# Patient Record
Sex: Male | Born: 1965 | Race: White | Hispanic: No | Marital: Married | Smoking: Never smoker
Health system: Southern US, Community
[De-identification: ages and names within clinical notes are randomized; demographics above are authoritative.]

## PROBLEM LIST (undated history)

## (undated) DIAGNOSIS — S6290XA Unspecified fracture of unspecified wrist and hand, initial encounter for closed fracture: Secondary | ICD-10-CM

## (undated) DIAGNOSIS — K219 Gastro-esophageal reflux disease without esophagitis: Secondary | ICD-10-CM

## (undated) DIAGNOSIS — Z789 Other specified health status: Secondary | ICD-10-CM

## (undated) HISTORY — DX: Gastro-esophageal reflux disease without esophagitis: K21.9

## (undated) HISTORY — DX: Other specified health status: Z78.9

## (undated) HISTORY — DX: Unspecified fracture of unspecified wrist and hand, initial encounter for closed fracture: S62.90XA

## (undated) HISTORY — PX: KNEE ARTHROSCOPY: SUR90

## (undated) HISTORY — PX: OTHER SURGICAL HISTORY: SHX169

## (undated) HISTORY — PX: WISDOM TOOTH EXTRACTION: SHX21

---

## 2005-02-13 ENCOUNTER — Emergency Department (HOSPITAL_COMMUNITY): Admission: EM | Admit: 2005-02-13 | Discharge: 2005-02-14 | Payer: Self-pay | Admitting: Emergency Medicine

## 2013-08-29 ENCOUNTER — Ambulatory Visit: Payer: Self-pay | Admitting: Family Medicine

## 2013-11-02 ENCOUNTER — Telehealth: Payer: Self-pay

## 2013-11-02 NOTE — Telephone Encounter (Addendum)
New Patient  Previous PCP:  Dr. Jacelyn Grip at [facility]  Medication List and allergies:  Updated and Reviewed  90 day supply/mail order: n/a Local prescriptions:  [id number] - [place], [address] AT [place] [address]  Immunization due:  UTD per patient  A/P: No changes to personal, family or PSH Flu- did not receive Tdap- unsure of when last received, states it has been within the past 10 years   To discuss with Wyatt Thorstenson: Nothing at this time.

## 2013-11-03 ENCOUNTER — Encounter: Payer: Self-pay | Admitting: General Practice

## 2013-11-03 ENCOUNTER — Encounter: Payer: Self-pay | Admitting: Family Medicine

## 2013-11-03 ENCOUNTER — Ambulatory Visit (INDEPENDENT_AMBULATORY_CARE_PROVIDER_SITE_OTHER): Payer: BC Managed Care – PPO | Admitting: Family Medicine

## 2013-11-03 VITALS — BP 120/80 | HR 70 | Temp 98.0°F | Resp 16 | Ht 73.0 in | Wt 185.2 lb

## 2013-11-03 DIAGNOSIS — Z0000 Encounter for general adult medical examination without abnormal findings: Secondary | ICD-10-CM

## 2013-11-03 DIAGNOSIS — Z23 Encounter for immunization: Secondary | ICD-10-CM

## 2013-11-03 LAB — BASIC METABOLIC PANEL
BUN: 14 mg/dL (ref 6–23)
CO2: 30 mEq/L (ref 19–32)
Calcium: 9.5 mg/dL (ref 8.4–10.5)
Chloride: 106 mEq/L (ref 96–112)
Creatinine, Ser: 1 mg/dL (ref 0.4–1.5)
GFR: 82.7 mL/min (ref >60.00)
Glucose, Bld: 86 mg/dL (ref 70–99)
Potassium: 3.9 mEq/L (ref 3.5–5.1)
Sodium: 141 mEq/L (ref 135–145)

## 2013-11-03 LAB — CBC WITH DIFFERENTIAL/PLATELET
Basophils Absolute: 0 10*3/uL (ref 0.0–0.1)
Basophils Relative: 0.3 % (ref 0.0–3.0)
Eosinophils Absolute: 0.3 10*3/uL (ref 0.0–0.7)
Eosinophils Relative: 4.1 % (ref 0.0–5.0)
HCT: 45 % (ref 39.0–52.0)
Hemoglobin: 15.5 g/dL (ref 13.0–17.0)
Lymphocytes Relative: 28.2 % (ref 12.0–46.0)
Lymphs Abs: 1.9 10*3/uL (ref 0.7–4.0)
MCHC: 34.5 g/dL (ref 30.0–36.0)
MCV: 89.7 fl (ref 78.0–100.0)
Monocytes Absolute: 0.4 10*3/uL (ref 0.1–1.0)
Monocytes Relative: 5.8 % (ref 3.0–12.0)
Neutro Abs: 4.1 10*3/uL (ref 1.4–7.7)
Neutrophils Relative %: 61.6 % (ref 43.0–77.0)
Platelets: 205 10*3/uL (ref 150.0–400.0)
RBC: 5.01 Mil/uL (ref 4.22–5.81)
RDW: 13.6 % (ref 11.5–15.5)
WBC: 6.7 10*3/uL (ref 4.0–10.5)

## 2013-11-03 LAB — HEPATIC FUNCTION PANEL
ALT: 21 U/L (ref 0–53)
AST: 24 U/L (ref 0–37)
Albumin: 4.4 g/dL (ref 3.5–5.2)
Alkaline Phosphatase: 46 U/L (ref 39–117)
Bilirubin, Direct: 0.2 mg/dL (ref 0.0–0.3)
Total Bilirubin: 1.3 mg/dL — ABNORMAL HIGH (ref 0.2–1.2)
Total Protein: 7.2 g/dL (ref 6.0–8.3)

## 2013-11-03 LAB — TSH: TSH: 2.29 u[IU]/mL (ref 0.35–4.50)

## 2013-11-03 LAB — LIPID PANEL
Cholesterol: 156 mg/dL (ref 0–200)
HDL: 40.2 mg/dL (ref >39.00)
LDL Cholesterol: 81 mg/dL (ref 0–99)
NonHDL: 115.8
Total CHOL/HDL Ratio: 4
Triglycerides: 175 mg/dL — ABNORMAL HIGH (ref 0.0–149.0)
VLDL: 35 mg/dL (ref 0.0–40.0)

## 2013-11-03 LAB — PSA: PSA: 0.9 ng/mL (ref 0.10–4.00)

## 2013-11-03 NOTE — Progress Notes (Signed)
   Subjective:    Patient ID: Darrell Reid, male    DOB: 24-Oct-1965, 48 y.o.   MRN: [id number]  HPI New to establish.  Previous MD- Yaakov Guthrie, [place] (last seen 5 yrs ago)  CPE- no concerns today   Review of Systems Patient reports no vision/hearing changes, anorexia, fever ,adenopathy, persistant/recurrent hoarseness, swallowing issues, chest pain, palpitations, edema, persistant/recurrent cough, hemoptysis, dyspnea (rest,exertional, paroxysmal nocturnal), gastrointestinal  bleeding (melena, rectal bleeding), abdominal pain, excessive heart burn, GU symptoms (dysuria, hematuria, voiding/incontinence issues) syncope, focal weakness, memory loss, numbness & tingling, skin/hair/nail changes, depression, anxiety, abnormal bruising/bleeding, musculoskeletal symptoms/signs.     Objective:   Physical Exam General Appearance:    Alert, cooperative, no distress, appears stated age  Head:    Normocephalic, without obvious abnormality, atraumatic  Eyes:    PERRL, conjunctiva/corneas clear, EOM's intact, fundi    benign, both eyes       Ears:    Normal TM's and external ear canals, both ears  Nose:   Nares normal, septum midline, mucosa normal, no drainage   or sinus tenderness  Throat:   Lips, mucosa, and tongue normal; teeth and gums normal  Neck:   Supple, symmetrical, trachea midline, no adenopathy;       thyroid:  No enlargement/tenderness/nodules  Back:     Symmetric, no curvature, ROM normal, no CVA tenderness  Lungs:     Clear to auscultation bilaterally, respirations unlabored  Chest wall:    No tenderness or deformity  Heart:    Regular rate and rhythm, S1 and S2 normal, no murmur, rub   or gallop  Abdomen:     Soft, non-tender, bowel sounds active all four quadrants,    no masses, no organomegaly  Genitalia:    Normal male without lesion, masses,discharge or tenderness  Rectal:    Deferred due to young age  Extremities:   Extremities normal, atraumatic, no cyanosis or edema    Pulses:   2+ and symmetric all extremities  Skin:   Skin color, texture, turgor normal, no rashes or lesions, large sebaceous cyst overlying R shoulder blade  Lymph nodes:   Cervical, supraclavicular, and axillary nodes normal  Neurologic:   CNII-XII intact. Normal strength, sensation and reflexes      throughout          Assessment & Plan:

## 2013-11-03 NOTE — Assessment & Plan Note (Signed)
Pt's PE WNL.  Check labs.  Anticipatory guidance provided.  

## 2013-11-03 NOTE — Progress Notes (Signed)
Pre visit review using our clinic review tool, if applicable. No additional management support is needed unless otherwise documented below in the visit note. 

## 2013-11-03 NOTE — Patient Instructions (Signed)
Follow up in 1 year or as needed Keep up the good work on healthy diet and regular exercise We'll notify you of your lab results and make any changes if needed Call with any questions or concerns Welcome!  We're glad to have you!

## 2014-10-19 ENCOUNTER — Telehealth: Payer: Self-pay | Admitting: Family Medicine

## 2014-10-19 NOTE — Telephone Encounter (Signed)
Pre Visit letter sent  °

## 2014-11-08 ENCOUNTER — Encounter: Payer: Self-pay | Admitting: *Deleted

## 2014-11-08 ENCOUNTER — Telehealth: Payer: Self-pay | Admitting: *Deleted

## 2014-11-08 NOTE — Telephone Encounter (Signed)
Pre-Visit Call completed with patient and chart updated.   Pre-Visit Info documented in Specialty Comments under [employer].    

## 2014-11-09 ENCOUNTER — Encounter: Payer: Self-pay | Admitting: Family Medicine

## 2014-11-09 ENCOUNTER — Ambulatory Visit (INDEPENDENT_AMBULATORY_CARE_PROVIDER_SITE_OTHER): Payer: BLUE CROSS/BLUE SHIELD | Admitting: Family Medicine

## 2014-11-09 VITALS — BP 122/82 | HR 60 | Temp 97.9°F | Resp 16 | Ht 73.0 in | Wt 186.5 lb

## 2014-11-09 DIAGNOSIS — L578 Other skin changes due to chronic exposure to nonionizing radiation: Secondary | ICD-10-CM

## 2014-11-09 DIAGNOSIS — Z0000 Encounter for general adult medical examination without abnormal findings: Secondary | ICD-10-CM | POA: Diagnosis not present

## 2014-11-09 DIAGNOSIS — Z3009 Encounter for other general counseling and advice on contraception: Secondary | ICD-10-CM

## 2014-11-09 NOTE — Assessment & Plan Note (Signed)
Pt's PE WNL w/ exception of sun damage on face.  Refer to derm.  Refer to urology for vasectomy consultation.  Check labs.  Anticipatory guidance provided.

## 2014-11-09 NOTE — Progress Notes (Signed)
   Subjective:    Patient ID: Darrell Reid, male    DOB: 27-May-1965, 49 y.o.   MRN: [id number]  HPI CPE- concerns for sun damage, wants referral for vasectomy    Review of Systems Patient reports no vision/hearing changes, anorexia, fever ,adenopathy, persistant/recurrent hoarseness, swallowing issues, chest pain, palpitations, edema, persistant/recurrent cough, hemoptysis, dyspnea (rest,exertional, paroxysmal nocturnal), gastrointestinal  bleeding (melena, rectal bleeding), abdominal pain, excessive heart burn, GU symptoms (dysuria, hematuria, voiding/incontinence issues) syncope, focal weakness, memory loss, numbness & tingling, hair/nail changes, depression, anxiety, abnormal bruising/bleeding, musculoskeletal symptoms/signs.     Objective:   Physical Exam General Appearance:    Alert, cooperative, no distress, appears stated age  Head:    Normocephalic, without obvious abnormality, atraumatic  Eyes:    PERRL, conjunctiva/corneas clear, EOM's intact, fundi    benign, both eyes       Ears:    Normal TM's and external ear canals, both ears  Nose:   Nares normal, septum midline, mucosa normal, no drainage   or sinus tenderness  Throat:   Lips, mucosa, and tongue normal; teeth and gums normal  Neck:   Supple, symmetrical, trachea midline, no adenopathy;       thyroid:  No enlargement/tenderness/nodules  Back:     Symmetric, no curvature, ROM normal, no CVA tenderness  Lungs:     Clear to auscultation bilaterally, respirations unlabored  Chest wall:    No tenderness or deformity  Heart:    Regular rate and rhythm, S1 and S2 normal, no murmur, rub   or gallop  Abdomen:     Soft, non-tender, bowel sounds active all four quadrants,    no masses, no organomegaly  Genitalia:    Deferred to urology  Rectal:    Extremities:   Extremities normal, atraumatic, no cyanosis or edema  Pulses:   2+ and symmetric all extremities  Skin:   Skin color, texture, turgor normal, sun damage noted on  bridge of nose  Lymph nodes:   Cervical, supraclavicular, and axillary nodes normal  Neurologic:   CNII-XII intact. Normal strength, sensation and reflexes      throughout          Assessment & Plan:

## 2014-11-09 NOTE — Patient Instructions (Signed)
Follow up in 1 year or as needed We'll notify you of your lab results and make any changes if needed Start a once daily multivitamin We'll call you with your Dermatology and Urology appt Call with any questions or concerns Keep up the good work! Congrats on the baby! Happy 4th of July!!!

## 2014-11-09 NOTE — Progress Notes (Signed)
Pre visit review using our clinic review tool, if applicable. No additional management support is needed unless otherwise documented below in the visit note. 

## 2014-11-10 ENCOUNTER — Encounter: Payer: Self-pay | Admitting: General Practice

## 2014-11-10 LAB — PSA: PSA: 0.89 ng/mL (ref 0.10–4.00)

## 2014-11-10 LAB — CBC WITH DIFFERENTIAL/PLATELET
Basophils Absolute: 0 10*3/uL (ref 0.0–0.1)
Basophils Relative: 0.4 % (ref 0.0–3.0)
Eosinophils Absolute: 0.3 10*3/uL (ref 0.0–0.7)
Eosinophils Relative: 4.5 % (ref 0.0–5.0)
HCT: 45.9 % (ref 39.0–52.0)
Hemoglobin: 15.6 g/dL (ref 13.0–17.0)
Lymphocytes Relative: 27.1 % (ref 12.0–46.0)
Lymphs Abs: 1.6 10*3/uL (ref 0.7–4.0)
MCHC: 34.1 g/dL (ref 30.0–36.0)
MCV: 90.5 fl (ref 78.0–100.0)
Monocytes Absolute: 0.3 10*3/uL (ref 0.1–1.0)
Monocytes Relative: 5.6 % (ref 3.0–12.0)
Neutro Abs: 3.8 10*3/uL (ref 1.4–7.7)
Neutrophils Relative %: 62.4 % (ref 43.0–77.0)
Platelets: 181 10*3/uL (ref 150.0–400.0)
RBC: 5.08 Mil/uL (ref 4.22–5.81)
RDW: 13.3 % (ref 11.5–15.5)
WBC: 6 10*3/uL (ref 4.0–10.5)

## 2014-11-10 LAB — LIPID PANEL
Cholesterol: 149 mg/dL (ref 0–200)
HDL: 38.1 mg/dL — ABNORMAL LOW (ref >39.00)
LDL Cholesterol: 87 mg/dL (ref 0–99)
NonHDL: 110.9
Total CHOL/HDL Ratio: 4
Triglycerides: 118 mg/dL (ref 0.0–149.0)
VLDL: 23.6 mg/dL (ref 0.0–40.0)

## 2014-11-10 LAB — BASIC METABOLIC PANEL
BUN: 18 mg/dL (ref 6–23)
CO2: 27 meq/L (ref 19–32)
Calcium: 9.5 mg/dL (ref 8.4–10.5)
Chloride: 105 mEq/L (ref 96–112)
Creatinine, Ser: 1.08 mg/dL (ref 0.40–1.50)
GFR: 77.95 mL/min (ref >60.00)
Glucose, Bld: 83 mg/dL (ref 70–99)
POTASSIUM: 4 meq/L (ref 3.5–5.1)
Sodium: 141 mEq/L (ref 135–145)

## 2014-11-10 LAB — HEPATIC FUNCTION PANEL
ALT: 15 U/L (ref 0–53)
AST: 20 U/L (ref 0–37)
Albumin: 4.4 g/dL (ref 3.5–5.2)
Alkaline Phosphatase: 51 U/L (ref 39–117)
Bilirubin, Direct: 0.2 mg/dL (ref 0.0–0.3)
Total Bilirubin: 0.8 mg/dL (ref 0.2–1.2)
Total Protein: 6.9 g/dL (ref 6.0–8.3)

## 2014-11-10 LAB — TSH: TSH: 2.07 u[IU]/mL (ref 0.35–4.50)

## 2015-11-08 ENCOUNTER — Encounter: Payer: BLUE CROSS/BLUE SHIELD | Admitting: Family Medicine

## 2015-11-12 ENCOUNTER — Encounter: Payer: BLUE CROSS/BLUE SHIELD | Admitting: Family Medicine

## 2015-11-14 ENCOUNTER — Encounter: Payer: BLUE CROSS/BLUE SHIELD | Admitting: Family Medicine

## 2016-08-13 HISTORY — PX: SHOULDER ARTHROSCOPY: SHX128

## 2016-11-20 ENCOUNTER — Encounter: Payer: Self-pay | Admitting: Family Medicine

## 2016-11-20 ENCOUNTER — Ambulatory Visit (INDEPENDENT_AMBULATORY_CARE_PROVIDER_SITE_OTHER): Payer: 59 | Admitting: Family Medicine

## 2016-11-20 ENCOUNTER — Telehealth: Payer: Self-pay | Admitting: Family Medicine

## 2016-11-20 VITALS — BP 120/86 | HR 99 | Temp 98.4°F | Ht 73.0 in | Wt 191.4 lb

## 2016-11-20 DIAGNOSIS — L290 Pruritus ani: Secondary | ICD-10-CM

## 2016-11-20 DIAGNOSIS — R195 Other fecal abnormalities: Secondary | ICD-10-CM | POA: Diagnosis not present

## 2016-11-20 MED ORDER — MEBENDAZOLE 100 MG PO CHEW: 100.0000 mg | CHEWABLE_TABLET | Freq: Once | ORAL | 0 refills | Status: DC

## 2016-11-20 NOTE — Telephone Encounter (Signed)
Pt would like to switch providers to Dr. Nani Ravens from Dr. [facility]. Pt says that the [place] is closer for him to travel    Is this switch okay?

## 2016-11-20 NOTE — Progress Notes (Signed)
Chief Complaint  Patient presents with  . Acute Visit    anus problem    Subjective: Patient is a 51 y.o. male here for anal issue.  Over the past 3 months, the patient is been feeling like something is crawling around the outside of his anus. Other associated symptoms include loose stools. He thought that there was an infestation of a birds nest and wonders if he could've infected and self with his daughters dirty diaper. He is not having any itching, pain, bleeding, weight loss, fevers, and is not visualized any foreign body or parasite. He has no sick contacts and no recent travel. He does not take any medications routinely and does not partake in illicit substance use.   ROS: Const: No fevers GI: As noted in HPI  Family History  Problem Relation Age of Onset  . Cancer Father        lung   Past Medical History:  Diagnosis Date  . No known health problems    No Known Allergies  Current Outpatient Prescriptions:  .  mebendazole (VERMOX) 100 MG chewable tablet, Chew 1 tablet (100 mg total) by mouth once., Disp: 1 tablet, Rfl: 0  Objective: BP 120/86 (BP Location: Left Arm, Patient Position: Sitting, Cuff Size: Normal)   Pulse 99   Temp 98.4 F (36.9 C) (Oral)   Ht 6\' 1"  (1.854 m)   Wt 191 lb 6 oz (86.8 kg)   SpO2 98%   BMI 25.25 kg/m  General: Awake, appears stated age HEENT: MMM, EOMi Heart: RRR, no murmurs Lungs: CTAB, no rales, wheezes or rhonchi. No accessory muscle use Abd: BS+, soft, NT, ND, no masses or organomegaly Rectal: I see some loss of epidermal layer of anal opening suggestive of rubbing/irritation; I do not see any eggs or parasites, no hemorrhoids, scotch tape test unremarkable, sphincter of good done, I did not appreciate any masses or internal hemorrhoids. Psych: Age appropriate judgment and insight, anxious  Assessment and Plan: Anal pruritus - Plan: mebendazole (VERMOX) 100 MG chewable tablet  Loose stools - Plan: Stool Culture, Ova and parasite  examination  Empiric tx for pin worms given hx, don't take before leaving stool sample. If no improvement, will reassess and consider referral to GI vs ID.  F/u in 3 weeks.  The patient voiced understanding and agreement to the plan.  [place], DO 11/20/16  5:04 PM

## 2016-11-20 NOTE — Telephone Encounter (Signed)
Ok to switch 

## 2016-11-20 NOTE — Telephone Encounter (Signed)
Pt has been scheduled.  °

## 2016-11-20 NOTE — Progress Notes (Signed)
Pre visit review using our clinic review tool, if applicable. No additional management support is needed unless otherwise documented below in the visit note. 

## 2016-11-20 NOTE — Patient Instructions (Addendum)
Do not take medicine until you give your stool sample.   Let us know if you need anything in the meantime.

## 2016-11-20 NOTE — Telephone Encounter (Signed)
OK w me.  

## 2016-11-24 ENCOUNTER — Telehealth: Payer: Self-pay | Admitting: Family Medicine

## 2016-11-24 LAB — OVA AND PARASITE EXAMINATION: OP: NONE SEEN

## 2016-11-24 NOTE — Telephone Encounter (Signed)
Called and informed the pt that results have not been completed.   Informed him once the results are back we will give him a call.  Pt verbalized understanding and stated that he had some questions for Dr. Nani Ravens but he could not talk at the moment.  So he will try to activate MyChart and sent Dr. Nani Ravens message.//AB/CMA

## 2016-11-24 NOTE — Telephone Encounter (Signed)
Pt called in for lab results.    CB:[phone]

## 2016-11-26 ENCOUNTER — Telehealth: Payer: Self-pay | Admitting: Family Medicine

## 2016-11-26 LAB — STOOL CULTURE

## 2016-11-26 NOTE — Telephone Encounter (Signed)
Called pt and he relayed that things have gotten a little better. There was a miscommunication and he did not realize that we called something in. He has started to see small white flecks in his stool and even on his face. It is improving. He states that these flecks were not present when he turned in his stool sample. All questions answered. F/u as originally scheduled.

## 2016-11-26 NOTE — Telephone Encounter (Signed)
Patient returned call informed patient  "stool culture is normal. I will see him at his follow up unless anything new comes up." patient  Voice understanding and stated he would like to speak with Dr. Nani Ravens directly patient did not want to elaborate,please advise

## 2016-11-28 ENCOUNTER — Telehealth: Payer: Self-pay

## 2016-11-28 ENCOUNTER — Other Ambulatory Visit: Payer: Self-pay | Admitting: *Deleted

## 2016-11-28 DIAGNOSIS — L290 Pruritus ani: Secondary | ICD-10-CM

## 2016-11-28 MED ORDER — MEBENDAZOLE 100 MG PO CHEW: 100.0000 mg | CHEWABLE_TABLET | Freq: Once | ORAL | 0 refills | Status: AC

## 2016-11-28 NOTE — Telephone Encounter (Signed)
PA initiated via Covermymeds; KEY: [id number]. Awaiting determination.

## 2016-12-03 ENCOUNTER — Encounter: Payer: Self-pay | Admitting: Family Medicine

## 2016-12-03 NOTE — Telephone Encounter (Signed)
Still waiting on authorization so it can be paid for. He can try Pin-X which is available over the counter. Make sure to repeat after 2 weeks. TY.

## 2016-12-03 NOTE — Telephone Encounter (Signed)
Patient is requesting to speak to Dr Nani Ravens regarding PA

## 2016-12-03 NOTE — Telephone Encounter (Signed)
Still awaiting determination.

## 2016-12-03 NOTE — Telephone Encounter (Signed)
Called and spoke with the pt and informed him of the message below.  He verbalized understanding and stated that he has been using the Pin-X and it helped and the stool was better.  But now it seems to be coming back.  He said that he will send Dr. Nani Ravens a MyChart message to let him know what is going on.//AB/CMA

## 2016-12-03 NOTE — Telephone Encounter (Signed)
Pt called in to check the status of his medication. Advised pt that I would have someone to look into for him.   He would like to be advised once a determination has been made.

## 2016-12-05 ENCOUNTER — Telehealth: Payer: Self-pay | Admitting: Family Medicine

## 2016-12-05 NOTE — Telephone Encounter (Signed)
Darrell Reid / pt's insurance says that medication Emverm is requiring a PA. He is requesting that someone from the office call and have PA set for urgent for an immediate response.    CB: [phone]

## 2016-12-08 ENCOUNTER — Encounter: Payer: Self-pay | Admitting: Family Medicine

## 2016-12-08 NOTE — Telephone Encounter (Signed)
PA has been sent awaiting approval.//AB/CMA

## 2016-12-11 NOTE — Telephone Encounter (Signed)
PA denied; Pt must have history of failure, contraindication or intolerance to over-the-counter pyrantel pamoate.

## 2016-12-12 ENCOUNTER — Encounter: Payer: Self-pay | Admitting: Family Medicine

## 2016-12-12 ENCOUNTER — Ambulatory Visit (INDEPENDENT_AMBULATORY_CARE_PROVIDER_SITE_OTHER): Payer: 59 | Admitting: Family Medicine

## 2016-12-12 VITALS — BP 136/88 | HR 70 | Temp 98.2°F | Ht 73.0 in | Wt 188.4 lb

## 2016-12-12 DIAGNOSIS — Z09 Encounter for follow-up examination after completed treatment for conditions other than malignant neoplasm: Secondary | ICD-10-CM | POA: Diagnosis not present

## 2016-12-12 NOTE — Progress Notes (Signed)
Chief Complaint  Patient presents with  . Transitions Of Care    Patient is here today to F/U with anal pruritus from 7.12.18.    Subjective: Patient is a 51 y.o. male here for f/u pinworms.  He was prescribed mebendazole however insurance denied the claim. He has taken 2 doses of Pin X and has had resolution of symptoms in his rectal area. Intermittently, he will notice a crawling sensation on his face. He will wait to scratch it to see if something is moving. Every time he looks in the mirror or grabs at it, there is nothing there. He is not having any nausea, vomiting, abdominal pain, or diarrhea.   ROS: GI: as noted in HPI Skin: No skin lesions  Family History  Problem Relation Age of Onset  . Cancer Father        lung   Past Medical History:  Diagnosis Date  . No known health problems    No Known Allergies  He is not taking any medications routinely.  Objective: BP 136/88 (BP Location: Left Arm, Patient Position: Sitting, Cuff Size: Normal)   Pulse 70   Temp 98.2 F (36.8 C) (Oral)   Ht 6\' 1"  (1.854 m)   Wt 188 lb 6.4 oz (85.5 kg)   SpO2 99%   BMI 24.86 kg/m  General: Awake, appears stated age HEENT: MMM, EOMi Heart: RRR, no murmurs Lungs: CTAB, no rales, wheezes or rhonchi. No accessory muscle use Abd: BS+, soft, NT, ND, no masses or organomegaly Skin: I do not appreciate any foreign bodies or organisms on his face, and his ears, or in his nose. Psych: Age appropriate judgment and insight, normal affect and mood  Assessment and Plan: Follow-up for resolved condition  Doing well, no further tx needed. The patient voiced understanding and agreement to the plan.  Greater than 25 minutes were spent face to face with the patient with greater than 50% of this time spent counseling on treating family members, need for tx for himself, hypervigilance on normal itches, transmissibility, and follow up.    [place], DO 12/12/16  10:06 AM

## 2016-12-12 NOTE — Patient Instructions (Signed)
Let us know if you need anything.  

## 2017-03-04 ENCOUNTER — Ambulatory Visit (INDEPENDENT_AMBULATORY_CARE_PROVIDER_SITE_OTHER): Payer: 59 | Admitting: Family Medicine

## 2017-03-04 ENCOUNTER — Encounter: Payer: Self-pay | Admitting: Family Medicine

## 2017-03-04 VITALS — BP 108/82 | HR 76 | Temp 98.6°F | Ht 72.0 in | Wt 189.2 lb

## 2017-03-04 DIAGNOSIS — Z0000 Encounter for general adult medical examination without abnormal findings: Secondary | ICD-10-CM | POA: Diagnosis not present

## 2017-03-04 DIAGNOSIS — Z114 Encounter for screening for human immunodeficiency virus [HIV]: Secondary | ICD-10-CM

## 2017-03-04 LAB — COMPREHENSIVE METABOLIC PANEL
ALK PHOS: 44 U/L (ref 39–117)
ALT: 23 U/L (ref 0–53)
AST: 21 U/L (ref 0–37)
Albumin: 4.4 g/dL (ref 3.5–5.2)
BUN: 16 mg/dL (ref 6–23)
CALCIUM: 9.7 mg/dL (ref 8.4–10.5)
CO2: 32 mEq/L (ref 19–32)
CREATININE: 1.02 mg/dL (ref 0.40–1.50)
Chloride: 105 mEq/L (ref 96–112)
GFR: 82.46 mL/min (ref >60.00)
Glucose, Bld: 97 mg/dL (ref 70–99)
Potassium: 4.4 mEq/L (ref 3.5–5.1)
Sodium: 143 mEq/L (ref 135–145)
Total Bilirubin: 0.9 mg/dL (ref 0.2–1.2)
Total Protein: 7 g/dL (ref 6.0–8.3)

## 2017-03-04 LAB — LIPID PANEL
Cholesterol: 152 mg/dL (ref 0–200)
HDL: 37 mg/dL — ABNORMAL LOW (ref >39.00)
LDL CALC: 88 mg/dL (ref 0–99)
NonHDL: 115.33
Total CHOL/HDL Ratio: 4
Triglycerides: 139 mg/dL (ref 0.0–149.0)
VLDL: 27.8 mg/dL (ref 0.0–40.0)

## 2017-03-04 LAB — CBC
HEMATOCRIT: 48.2 % (ref 39.0–52.0)
Hemoglobin: 16.4 g/dL (ref 13.0–17.0)
MCHC: 34 g/dL (ref 30.0–36.0)
MCV: 90.9 fl (ref 78.0–100.0)
Platelets: 193 10*3/uL (ref 150.0–400.0)
RBC: 5.31 Mil/uL (ref 4.22–5.81)
RDW: 13.1 % (ref 11.5–15.5)
WBC: 5.1 10*3/uL (ref 4.0–10.5)

## 2017-03-04 LAB — HIV ANTIBODY (ROUTINE TESTING W REFLEX): HIV 1&2 Ab, 4th Generation: NONREACTIVE

## 2017-03-04 NOTE — Patient Instructions (Addendum)
Brush your teeth twice daily.  Aim to do some physical exertion for 150 minutes per week. This is typically divided into 5 days per week, 30 minutes per day. The activity should be enough to get your heart rate up. Anything is better than nothing if you have time constraints.  Let us know if you need anything.

## 2017-03-04 NOTE — Progress Notes (Signed)
Pre visit review using our clinic review tool, if applicable. No additional management support is needed unless otherwise documented below in the visit note. 

## 2017-03-04 NOTE — Progress Notes (Signed)
Chief Complaint  Patient presents with  . Annual Exam    Well Male Darrell Reid is here for a complete physical.   His last physical was >1 year ago.  Current diet: in general, a "healthy" diet   Current exercise: golf, physically active at work Weight trend: stable Does pt snore? No. Daytime fatigue? No. Seat belt? Yes.     Health maintenance Tetanus- Yes HIV- No Prostate cancer screening- No  Past Medical History:  Diagnosis Date  . No known health problems     Past Surgical History:  Procedure Laterality Date  . KNEE ARTHROSCOPY    . SHOULDER ARTHROSCOPY Right 08/13/2016   Medications  Takes no medications routinely.   Allergies No Known Allergies   Family History Family History  Problem Relation Age of Onset  . Cancer Father        lung    Review of Systems: Constitutional:  no unexpected change in weight, no fevers or chills Eye:  no recent significant change in vision Ear/Nose/Mouth/Throat:  Ears:  no tinnitus or hearing loss Nose/Mouth/Throat:  no complaints of nasal congestion or bleeding, no sore throat and oral sores Cardiovascular:  no chest pain, no palpitations Respiratory:  no cough and no shortness of breath Gastrointestinal:  no abdominal pain, no change in bowel habits, no nausea, vomiting, diarrhea, or constipation and no black or bloody stool GU:  Male: negative for dysuria, frequency, and incontinence and negative for prostate symptoms Musculoskeletal/Extremities: +L foot pain; otherwise no pain, redness, or swelling of the joints Integumentary (Skin/Breast):  no abnormal skin lesions reported Neurologic:  no headaches, no numbness, tingling Endocrine: No weight changes Hematologic/Lymphatic:  no abnormal bleeding, no HIV risk factors  Exam BP 108/82 (BP Location: Left Arm, Patient Position: Sitting, Cuff Size: Normal)   Pulse 76   Temp 98.6 F (37 C) (Oral)   Ht 6' (1.829 m)   Wt 189 lb 4 oz (85.8 kg)   SpO2 97%   BMI 25.67  kg/m  General:  well developed, well nourished, in no apparent distress Skin:  no significant moles, warts, or growths Head:  no masses, lesions, or tenderness Eyes:  pupils equal and round, sclera anicteric without injection Ears:  canals without lesions, TMs shiny without retraction, no obvious effusion, no erythema Nose:  nares patent, septum midline, mucosa normal Throat/Pharynx:  lips and gingiva without lesion; tongue and uvula midline; non-inflamed pharynx; no exudates or postnasal drainage Neck: neck supple without adenopathy, thyromegaly, or masses Lungs:  clear to auscultation, breath sounds equal bilaterally, no respiratory distress Cardio:  regular rate and rhythm without murmurs, heart sounds without clicks or rubs Abdomen:  abdomen soft, nontender; bowel sounds normal; no masses or organomegaly Genital (male): circumcised penis, no lesions or discharge; testes present bilaterally without masses or tenderness Rectal: Deferred Musculoskeletal:  symmetrical muscle groups noted without atrophy or deformity Extremities:  no clubbing, cyanosis, or edema, no deformities, no skin discoloration Neuro:  gait normal; deep tendon reflexes normal and symmetric Psych: well oriented with normal range of affect and appropriate judgment/insight  Assessment and Plan  Well adult exam - Plan: CBC, Comprehensive metabolic panel, Lipid panel  Encounter for screening for HIV - Plan: HIV antibody   Well 51 y.o. male. Counseled on diet and exercise. Counseled on risks and benefits of fossa cancer screening with PSA. He opted to forego testing.  Other orders as above. Follow up in 1 year pending the above workup. The patient voiced understanding and agreement to the  plan.  Shelda Pal, DO 03/04/17 8:59 AM

## 2017-05-26 ENCOUNTER — Encounter: Payer: Self-pay | Admitting: Family Medicine

## 2017-06-12 ENCOUNTER — Encounter: Payer: Self-pay | Admitting: Family Medicine

## 2017-06-16 ENCOUNTER — Encounter: Payer: Self-pay | Admitting: Family Medicine

## 2017-06-25 ENCOUNTER — Encounter: Payer: Self-pay | Admitting: Family Medicine

## 2017-06-26 ENCOUNTER — Telehealth: Payer: Self-pay

## 2017-06-26 ENCOUNTER — Other Ambulatory Visit: Payer: Self-pay | Admitting: Family Medicine

## 2017-06-26 ENCOUNTER — Encounter: Payer: Self-pay | Admitting: Family Medicine

## 2017-06-26 MED ORDER — MEBENDAZOLE 100 MG PO CHEW: 100.0000 mg | CHEWABLE_TABLET | Freq: Once | ORAL | 0 refills | Status: AC

## 2017-06-26 NOTE — Progress Notes (Signed)
Mebendazole ordered for pin worms. He has been on and failed Pin-X FYI to [facility]. TY.

## 2017-06-26 NOTE — Telephone Encounter (Signed)
Caller name: Darrell Reid Relationship to patient: self Can be reached: [phone] Pharmacy:  [facility] [place], [address] AT [facility] [place] [phone] (Phone) [phone] (Fax)   Reason for call: pt called stating insurance did override this time for him to get 1 pill of mebendazole (VERMOX) 100 MG chewable tablet Regular price is $500. Pt was told we need to contact insurance for PA. Pt states pharmacy gave him [employer] fax # [phone] or ph# [phone]  Pharmacy said the form was sent 2x.

## 2017-06-26 NOTE — Telephone Encounter (Signed)
PA initiated via Covermymeds; KEY: [id number]. Awaiting determination.

## 2017-06-29 NOTE — Telephone Encounter (Signed)
PA approved until 07/24/2017.

## 2017-07-02 ENCOUNTER — Encounter: Payer: Self-pay | Admitting: Family Medicine

## 2017-07-08 ENCOUNTER — Encounter: Payer: Self-pay | Admitting: Family Medicine

## 2017-07-08 ENCOUNTER — Ambulatory Visit: Payer: 59 | Admitting: Family Medicine

## 2017-07-08 VITALS — BP 110/82 | HR 107 | Temp 99.0°F | Ht 72.0 in | Wt 185.4 lb

## 2017-07-08 DIAGNOSIS — R6889 Other general symptoms and signs: Secondary | ICD-10-CM

## 2017-07-08 MED ORDER — OSELTAMIVIR PHOSPHATE 75 MG PO CAPS: 75.0000 mg | ORAL_CAPSULE | Freq: Two times a day (BID) | ORAL | 0 refills | Status: AC

## 2017-07-08 NOTE — Progress Notes (Signed)
Pre visit review using our clinic review tool, if applicable. No additional management support is needed unless otherwise documented below in the visit note. 

## 2017-07-08 NOTE — Progress Notes (Signed)
Chief Complaint  Patient presents with  . Chills  . Generalized Body Aches    Darrell Reid here for URI complaints.  Duration: 1 day  Associated symptoms: Fever (99's), sinus congestion, rhinorrhea, myalgia and cough Denies: sinus pain, itchy watery eyes, ear pain, ear drainage, sore throat, wheezing and shortness of breath Treatment to date: ibuprofen Sick contacts: Yes- daughter  ROS:  Const: +fevers HEENT: As noted in HPI Lungs: No SOB  Past Medical History:  Diagnosis Date  . No known health problems    Family History  Problem Relation Age of Onset  . Cancer Father        lung    BP 110/82 (BP Location: Left Arm, Patient Position: Sitting, Cuff Size: Normal)   Pulse (!) 107   Temp 99 F (37.2 C) (Oral)   Ht 6' (1.829 m)   Wt 185 lb 6 oz (84.1 kg)   SpO2 97%   BMI 25.14 kg/m  General: Awake, alert, appears stated age HEENT: AT, [place], ears patent b/l and TM's neg, nares patent w/o discharge, pharynx pink and without exudates, MMM Neck: No masses or asymmetry Heart: RRR Lungs: CTAB, no accessory muscle use Psych: Age appropriate judgment and insight, normal mood and affect  Flu-like symptoms - Plan: oseltamivir (TAMIFLU) 75 MG capsule  Orders as above. NSAIDs, Tylenol, fluids, practice good hand hygiene, cover mouth when coughing. F/u prn. If starting to experience increasing fevers, shaking, or shortness of breath, seek immediate care. Pt voiced understanding and agreement to the plan.  [place], DO 07/08/17 11:26 AM

## 2017-07-08 NOTE — Patient Instructions (Signed)
Continue to push fluids, practice good hand hygiene, and cover your mouth if you cough.  If you start having increasing fevers, shaking or shortness of breath, seek immediate care.  Ibuprofen 400-600 mg (2-3 over the counter strength tabs) every 6 hours as needed for pain.  OK to take Tylenol 1000 mg (2 extra strength tabs) or 975 mg (3 regular strength tabs) every 6 hours as needed.  Let us know if you need anything.  

## 2017-07-13 DIAGNOSIS — L308 Other specified dermatitis: Secondary | ICD-10-CM | POA: Diagnosis not present

## 2017-07-19 ENCOUNTER — Encounter: Payer: Self-pay | Admitting: Family Medicine

## 2017-07-30 ENCOUNTER — Encounter: Payer: Self-pay | Admitting: Family Medicine

## 2017-08-02 ENCOUNTER — Encounter: Payer: Self-pay | Admitting: Family Medicine

## 2017-08-03 NOTE — Telephone Encounter (Signed)
Have him schedule an appt if he is still having issues. TY.

## 2017-08-05 ENCOUNTER — Ambulatory Visit: Payer: 59 | Admitting: Family Medicine

## 2017-08-05 ENCOUNTER — Encounter: Payer: Self-pay | Admitting: Family Medicine

## 2017-08-05 VITALS — BP 108/74 | HR 65 | Temp 98.2°F | Ht 72.0 in | Wt 183.0 lb

## 2017-08-05 DIAGNOSIS — K219 Gastro-esophageal reflux disease without esophagitis: Secondary | ICD-10-CM | POA: Diagnosis not present

## 2017-08-05 DIAGNOSIS — F418 Other specified anxiety disorders: Secondary | ICD-10-CM

## 2017-08-05 NOTE — Patient Instructions (Addendum)
Options when you have reflux flares: Pepcid (famotidine) 20 mg twice daily or Zantac (ranitidine) 150 mg twice daily as needed.  The only lifestyle changes that have data behind them are weight loss for the overweight/obese and elevating the head of the bed. Finding out which foods/positions are triggers is important.  Counseling info: contact [phone] to schedule an appointment or inquire about cost/insurance coverage.  Coping skills Choose 5 that work for you:  Take a deep breath  Count to 20  Read a book  Do a puzzle  Meditate  Bake  Sing  Knit  Garden  Pray  Go outside  Call a friend  Listen to music  Take a walk  Color  Send a note  Take a bath  Watch a movie  Be alone in a quiet place  Pet an animal  Visit a friend  Journal  Exercise  Stretch   Let us know if you need anything.

## 2017-08-05 NOTE — Progress Notes (Signed)
Chief Complaint  Patient presents with  . Abdominal Pain    Darrell Reid is here for epigastric abdominal pain.  Has been having issues with stress and epigastric abd burning. No radiation. Intermittent bouts of diarrhea.  In addition to having shoulder surgery within the past 12 months and having to be out of work, dealing with pinworms, and having a young child at home, he has been dealing with a lot of stress.  He is questioning whether the stress is adding to his symptoms.  1 of his dermatologist friends rx'd him hydroxyzine that he believes is helping. He has used it 3 times in the past mo. Denies fevers, bleeding, unintentional weight loss, or nighttime awakenings.  ROS: Constitutional: No fevers GI: no bleeding + pain  Past Medical History:  Diagnosis Date  . No known health problems    Family History  Problem Relation Age of Onset  . Cancer Father        lung   Past Surgical History:  Procedure Laterality Date  . KNEE ARTHROSCOPY    . SHOULDER ARTHROSCOPY Right 08/13/2016   No Known Allergies  BP 108/74 (BP Location: Left Arm, Patient Position: Sitting, Cuff Size: Normal)   Pulse 65   Temp 98.2 F (36.8 C) (Oral)   Ht 6' (1.829 m)   Wt 183 lb (83 kg)   SpO2 97%   BMI 24.82 kg/m  Gen.: Awake, alert, appears stated age 52: Mucous membranes moist without mucosal lesions Heart: Regular rate and rhythm without murmurs Lungs: Clear auscultation bilaterally, no rales or wheezing, normal effort without accessory muscle use. Abdomen: Bowel sounds are present. Abdomen is soft, mildly ttp in lower quadrants, nondistended, no masses or organomegaly. Negative Murphy's, Rovsing's, McBurney's, and Carnett's sign. Skin: Scalp WNL, no lesions on face Psych: Age appropriate judgment and insight. Normal mood and affect.  Gastroesophageal reflux disease, esophagitis presence not specified  Situational anxiety  H2 blocker info written down. [facility] info given. I think he is  correct about stress causing his GI s/s's. He is starting to improve, will monitor and refill hydroxyzine if needed. F/u as originally scheduled for CPE. Pt voiced understanding and agreement to the plan.  Greater than 25 minutes were spent face to face with the patient with greater than 50% of this time spent counseling on reflux, stress/situational anxiety.   Osgood, DO 08/05/17 8:24 AM

## 2017-09-03 ENCOUNTER — Encounter: Payer: Self-pay | Admitting: Family Medicine

## 2017-09-27 ENCOUNTER — Encounter: Payer: Self-pay | Admitting: Family Medicine

## 2017-09-29 DIAGNOSIS — S46211D Strain of muscle, fascia and tendon of other parts of biceps, right arm, subsequent encounter: Secondary | ICD-10-CM | POA: Diagnosis not present

## 2017-09-30 ENCOUNTER — Encounter: Payer: Self-pay | Admitting: Internal Medicine

## 2017-09-30 ENCOUNTER — Encounter: Payer: Self-pay | Admitting: Gastroenterology

## 2017-09-30 ENCOUNTER — Ambulatory Visit: Payer: 59 | Admitting: Internal Medicine

## 2017-09-30 VITALS — BP 122/68 | HR 78 | Temp 98.0°F | Resp 14 | Ht 72.0 in | Wt 179.4 lb

## 2017-09-30 DIAGNOSIS — R1013 Epigastric pain: Secondary | ICD-10-CM | POA: Diagnosis not present

## 2017-09-30 DIAGNOSIS — M25519 Pain in unspecified shoulder: Secondary | ICD-10-CM

## 2017-09-30 MED ORDER — PANTOPRAZOLE SODIUM 40 MG PO TBEC: 40.0000 mg | DELAYED_RELEASE_TABLET | Freq: Every day | ORAL | 2 refills | Status: DC

## 2017-09-30 NOTE — Progress Notes (Signed)
Subjective:    Patient ID: Darrell Reid, male    DOB: 10/20/1965, 52 y.o.   MRN: [id number]  DOS:  09/30/2017 Type of visit - description : Acute visit Interval history: His chief complaint today is upper, bilateral, abdominal pain. The pain is described as dull, achy, steady. From time to time he has epigastric burning. He has tried Zantac, Pepto-Bismol, Tums with only partial relief. On further questioning, he has shoulder pain and he is taking 2 to  3 Motrins daily.  Also, in the last few months he had other GI sx  on and off , eventually was treated with  mebendazole empirically by his primary doctor.  Review of Systems Denies fever chills or weight loss No nausea, vomiting, diarrhea.  No blood in the stools. No dysuria or gross hematuria Appetite is normal. No classic heartburn, dysphagia or odynophagia.  Past Medical History:  Diagnosis Date  . No known health problems     Past Surgical History:  Procedure Laterality Date  . KNEE ARTHROSCOPY    . SHOULDER ARTHROSCOPY Right 08/13/2016    Social History   Socioeconomic History  . Marital status: Married    Spouse name: Not on file  . Number of children: Not on file  . Years of education: Not on file  . Highest education level: Not on file  Occupational History  . Not on file  Social Needs  . Financial resource strain: Not on file  . Food insecurity:    Worry: Not on file    Inability: Not on file  . Transportation needs:    Medical: Not on file    Non-medical: Not on file  Tobacco Use  . Smoking status: Never Smoker  . Smokeless tobacco: Never Used  Substance and Sexual Activity  . Alcohol use: Yes  . Drug use: No  . Sexual activity: Not on file  Lifestyle  . Physical activity:    Days per week: Not on file    Minutes per session: Not on file  . Stress: Not on file  Relationships  . Social connections:    Talks on phone: Not on file    Gets together: Not on file    Attends religious service:  Not on file    Active member of club or organization: Not on file    Attends meetings of clubs or organizations: Not on file    Relationship status: Not on file  . Intimate partner violence:    Fear of current or ex partner: Not on file    Emotionally abused: Not on file    Physically abused: Not on file    Forced sexual activity: Not on file  Other Topics Concern  . Not on file  Social History Narrative  . Not on file      Allergies as of 09/30/2017   No Known Allergies     Medication List        Accurate as of 09/30/17 11:59 PM. Always use your most recent med list.          pantoprazole 40 MG tablet Commonly known as:  PROTONIX Take 1 tablet (40 mg total) by mouth daily before breakfast.          Objective:   Physical Exam BP 122/68 (BP Location: Left Arm, Patient Position: Sitting, Cuff Size: Small)   Pulse 78   Temp 98 F (36.7 C) (Oral)   Resp 14   Ht 6' (1.829 m)   Wt 179  lb 6 oz (81.4 kg)   SpO2 99%   BMI 24.33 kg/m  General:   Well developed, well nourished . NAD.  HEENT:  Normocephalic . Face symmetric, atraumatic.  Not pale Lungs:  CTA B Normal respiratory effort, no intercostal retractions, no accessory muscle use. Heart: RRR,  no murmur.  no pretibial edema bilaterally  Abdomen:  Not distended, soft, mildly to moderately tender at the epigastric area without mass or rebound Skin: Not pale. Not jaundice Neurologic:  alert & oriented X3.  Speech normal, gait appropriate for age and unassisted Psych--  Cognition and judgment appear intact.  Cooperative with normal attention span and concentration.  Behavior appropriate. No anxious or depressed appearing.     Assessment & Plan:   52 year old gentleman, history of acid reflux and anxiety presents with  Upper abdominal discomfort, epigastric tenderness: Above symptoms in the context of daily use of ibuprofen.  No red flags. Suspect PUD, possibly induced by NSAIDs VS other  conditions. Plan:  CMP, CBC.  Stop NSAIDs and OTC medications for dyspepsia, try Protonix 1 daily.  Refer to GI for possibly EGD.  Patient verbalized understanding. Shoulder pain: Stop NSAIDs, Tylenol is okay.

## 2017-09-30 NOTE — Patient Instructions (Signed)
GO TO THE LAB : Get the blood work     Stop ibuprofen, naproxen or any similar anti-inflammatories  Okay to take Tylenol 500 mg 1 or 2 tablets every 8 hours as needed for pain  Pantoprazole 40 mg 1 tablet before breakfast every morning  We are referring you to the gastroenterologist; If you do not hear from them in few days please let us know  Call back anytime if you have severe symptoms

## 2017-09-30 NOTE — Progress Notes (Signed)
Pre visit review using our clinic review tool, if applicable. No additional management support is needed unless otherwise documented below in the visit note. 

## 2017-10-01 LAB — CBC WITH DIFFERENTIAL/PLATELET
Basophils Absolute: 0.1 10*3/uL (ref 0.0–0.1)
Basophils Relative: 0.7 % (ref 0.0–3.0)
Eosinophils Absolute: 0.2 10*3/uL (ref 0.0–0.7)
Eosinophils Relative: 2.2 % (ref 0.0–5.0)
HCT: 47.4 % (ref 39.0–52.0)
Hemoglobin: 16.5 g/dL (ref 13.0–17.0)
Lymphocytes Relative: 20.9 % (ref 12.0–46.0)
Lymphs Abs: 1.6 10*3/uL (ref 0.7–4.0)
MCHC: 34.7 g/dL (ref 30.0–36.0)
MCV: 88.4 fl (ref 78.0–100.0)
Monocytes Absolute: 0.5 10*3/uL (ref 0.1–1.0)
Monocytes Relative: 6.4 % (ref 3.0–12.0)
Neutro Abs: 5.2 10*3/uL (ref 1.4–7.7)
Neutrophils Relative %: 69.8 % (ref 43.0–77.0)
Platelets: 235 10*3/uL (ref 150.0–400.0)
RBC: 5.36 Mil/uL (ref 4.22–5.81)
RDW: 14.1 % (ref 11.5–15.5)
WBC: 7.4 10*3/uL (ref 4.0–10.5)

## 2017-10-01 LAB — COMPREHENSIVE METABOLIC PANEL
ALT: 11 U/L (ref 0–53)
AST: 16 U/L (ref 0–37)
Albumin: 4.6 g/dL (ref 3.5–5.2)
Alkaline Phosphatase: 51 U/L (ref 39–117)
BUN: 17 mg/dL (ref 6–23)
CO2: 30 mEq/L (ref 19–32)
Calcium: 10.2 mg/dL (ref 8.4–10.5)
Chloride: 103 mEq/L (ref 96–112)
Creatinine, Ser: 1.06 mg/dL (ref 0.40–1.50)
GFR: 78.69 mL/min (ref >60.00)
Glucose, Bld: 82 mg/dL (ref 70–99)
Potassium: 4.7 mEq/L (ref 3.5–5.1)
Sodium: 141 mEq/L (ref 135–145)
Total Bilirubin: 0.6 mg/dL (ref 0.2–1.2)
Total Protein: 7.2 g/dL (ref 6.0–8.3)

## 2017-10-06 ENCOUNTER — Encounter: Payer: Self-pay | Admitting: Internal Medicine

## 2017-10-07 ENCOUNTER — Ambulatory Visit: Payer: 59 | Admitting: Family Medicine

## 2017-11-03 ENCOUNTER — Encounter: Payer: Self-pay | Admitting: Gastroenterology

## 2017-11-03 ENCOUNTER — Encounter

## 2017-11-03 ENCOUNTER — Ambulatory Visit (INDEPENDENT_AMBULATORY_CARE_PROVIDER_SITE_OTHER): Payer: 59 | Admitting: Gastroenterology

## 2017-11-03 VITALS — BP 132/86 | HR 80 | Ht 72.0 in | Wt 179.1 lb

## 2017-11-03 DIAGNOSIS — R1013 Epigastric pain: Secondary | ICD-10-CM | POA: Diagnosis not present

## 2017-11-03 DIAGNOSIS — G8929 Other chronic pain: Secondary | ICD-10-CM | POA: Diagnosis not present

## 2017-11-03 MED ORDER — PANTOPRAZOLE SODIUM 40 MG PO TBEC: 40.0000 mg | DELAYED_RELEASE_TABLET | Freq: Every day | ORAL | 3 refills | Status: DC

## 2017-11-03 NOTE — Patient Instructions (Addendum)
If you are age 50 or older, your body mass index should be between 23-30. Your Body mass index is 24.29 kg/m. If this is out of the aforementioned range listed, please consider follow up with your Primary Care Provider.  If you are age 28 or younger, your body mass index should be between 19-25. Your Body mass index is 24.29 kg/m. If this is out of the aformentioned range listed, please consider follow up with your Primary Care Provider.   You have been scheduled for an endoscopy. Please follow written instructions given to you at your visit today. If you use inhalers (even only as needed), please bring them with you on the day of your procedure. Your physician has requested that you go to www.startemmi.com and enter the access code given to you at your visit today. This web site gives a general overview about your procedure. However, you should still follow specific instructions given to you by our office regarding your preparation for the procedure.  Please call us to schedule your screening colonoscopy at age 89.  Thank you,  Dr. Jackquline Denmark

## 2017-11-03 NOTE — Progress Notes (Signed)
Chief Complaint: Epigastric pain  Referring Provider:  Shelda Pal*      ASSESSMENT AND PLAN;   #1. Epigastric pain:  D/d PUD, GERD, gastritis, nonulcer dyspepsia, gastroparesis, musculoskeletal etiology, r/o gallbladder or pancreatic problems. Nl CBC and CMP. - Increase Protonix to 40 mg p.o. twice daily. - Proceed with EGD for further evaluation.  I discussed risks and benefits. - If above work-up is negative, would recommend ultrasound of the abdomen possibly followed by a HIDA scan ejection fraction. - Avoid nonsteroidals.   #2.  Colorectal cancer screening -As per current recommendations, would recommend colonoscopy at the age of 81 (sept 2019).    HPI:    Darrell Reid is a 52 y.o. male  With epigastric pain -mostly burning, associated mild nausea but no vomiting, no melena or hematochezia Has been taking Motrin for right shoulder pain ever since his surgery. Has tried Zantac, Pepto-Bismol and Tums with partial relief.  He was seen by Dr. Larose Kells and started on Protonix 40 mg p.o. once a day with about 50 to 60% relief. Denies having any odynophagia or dysphagia. No previous history of ulcers Has stopped taking Motrin Has been advised to get EGD performed.  Denies having any significant lower GI complaints.  Has alternating diarrhea and constipation at times.  Has been under considerable stress since he has been out of work after right shoulder surgery and his dad passed away.  He is known to me.  Patient went to [employer].  His son Fabio Neighbors plays soccer with my son's   Past Medical History:  Diagnosis Date  . No known health problems     Past Surgical History:  Procedure Laterality Date  . KNEE ARTHROSCOPY Bilateral   . SHOULDER ARTHROSCOPY Right 08/13/2016    Family History  Problem Relation Age of Onset  . Aortic aneurysm Mother   . Cancer Father        lung    Social History  Known to me since Fabio Neighbors his son plays soccer with my  kids Tobacco Use  . Smoking status: Never Smoker  . Smokeless tobacco: Never Used  Substance Use Topics  . Alcohol use: Yes  . Drug use: No    Current Outpatient Medications  Medication Sig Dispense Refill  . pantoprazole (PROTONIX) 40 MG tablet Take 1 tablet (40 mg total) by mouth daily before breakfast. 30 tablet 2   No current facility-administered medications for this visit.     No Known Allergies  Review of Systems:  Constitutional: Denies fever, chills, diaphoresis, appetite change and fatigue.  HEENT: Denies photophobia, eye pain, redness, hearing loss, ear pain, congestion, sore throat, rhinorrhea, sneezing, mouth sores, neck pain, neck stiffness and tinnitus.   Respiratory: Denies SOB, DOE, cough, chest tightness,  and wheezing.   Cardiovascular: Denies chest pain, palpitations and leg swelling.  Genitourinary: Denies dysuria, urgency, frequency, hematuria, flank pain and difficulty urinating.  Musculoskeletal: Denies myalgias, back pain, joint swelling, arthralgias and gait problem.  Skin: No rash.  Neurological: Denies dizziness, seizures, syncope, weakness, light-headedness, numbness and headaches.  Hematological: Denies adenopathy. Easy bruising, personal or family bleeding history  Psychiatric/Behavioral: No anxiety or depression     Physical Exam:    BP 132/86   Pulse 80   Ht 6' (1.829 m)   Wt 179 lb 2 oz (81.3 kg)   BMI 24.29 kg/m  Filed Weights   11/03/17 1017  Weight: 179 lb 2 oz (81.3 kg)   Constitutional:  Well-developed, in no  acute distress. Psychiatric: Normal mood and affect. Behavior is normal. HEENT: Pupils normal.  Conjunctivae are normal. No scleral icterus. Neck supple.  Cardiovascular: Normal rate, regular rhythm. No edema Pulmonary/chest: Effort normal and breath sounds normal. No wheezing, rales or rhonchi. Abdominal: Soft, nondistended.  Mild epigastric tenderness. Bowel sounds active throughout. There are no masses palpable. No  hepatomegaly. Rectal:  defered Neurological: Alert and oriented to person place and time. Skin: Skin is warm and dry. No rashes noted.  Data Reviewed: I have personally reviewed following labs and imaging studies  CBC: CBC Latest Ref Rng & Units 09/30/2017 03/04/2017 11/09/2014  WBC 4.0 - 10.5 K/uL 7.4 5.1 6.0  Hemoglobin 13.0 - 17.0 g/dL 16.5 16.4 15.6  Hematocrit 39.0 - 52.0 % 47.4 48.2 45.9  Platelets 150.0 - 400.0 K/uL 235.0 193.0 181.0    CMP: CMP Latest Ref Rng & Units 09/30/2017 03/04/2017 11/09/2014  Glucose 70 - 99 mg/dL 82 97 83  BUN 6 - 23 mg/dL 17 16 18   Creatinine 0.40 - 1.50 mg/dL 1.06 1.02 1.08  Sodium 135 - 145 mEq/L 141 143 141  Potassium 3.5 - 5.1 mEq/L 4.7 4.4 4.0  Chloride 96 - 112 mEq/L 103 105 105  CO2 19 - 32 mEq/L 30 32 27  Calcium 8.4 - 10.5 mg/dL 10.2 9.7 9.5  Total Protein 6.0 - 8.3 g/dL 7.2 7.0 6.9  Total Bilirubin 0.2 - 1.2 mg/dL 0.6 0.9 0.8  Alkaline Phos 39 - 117 U/L 51 44 51  AST 0 - 37 U/L 16 21 20   ALT 0 - 53 U/L 11 23 15      Carmell Austria, MD 11/03/2017, 10:32 AM  Cc: Shelda Pal*

## 2017-11-06 ENCOUNTER — Encounter: Payer: Self-pay | Admitting: Gastroenterology

## 2017-11-10 ENCOUNTER — Telehealth: Payer: Self-pay | Admitting: Internal Medicine

## 2017-11-10 NOTE — Telephone Encounter (Signed)
Copied from [place] [id number]. Topic: Quick Communication - Rx Refill/Question >> Nov 10, 2017  5:00 PM Neva Seat wrote: pantoprazole (PROTONIX) 40 MG tablet  2 times a day - per Dr. Arelia Sneddon /  endoscopy on Wed 7-10.  Pt is needing a new Rx for 2 times

## 2017-11-11 MED ORDER — PANTOPRAZOLE SODIUM 40 MG PO TBEC: 40.0000 mg | DELAYED_RELEASE_TABLET | Freq: Two times a day (BID) | ORAL | 1 refills | Status: DC

## 2017-11-11 NOTE — Telephone Encounter (Signed)
Ok

## 2017-11-11 NOTE — Telephone Encounter (Signed)
OK 

## 2017-11-11 NOTE — Addendum Note (Signed)
Addended by: Sharon Seller B on: 11/11/2017 09:01 AM   Modules accepted: Orders

## 2017-11-13 ENCOUNTER — Other Ambulatory Visit: Payer: Self-pay | Admitting: Family Medicine

## 2017-11-13 NOTE — Telephone Encounter (Signed)
Copied from [place] [id number]. Topic: Quick Communication - Rx Refill/Question >> Nov 13, 2017  2:28 PM Lennox Solders wrote: Medication: pantoprazole 40 mg . Pt takes 2 pills a day. Pharm is calling and needs new rx pantoprazole 40 mg #180 (Agent: If yes, when and what did the pharmacy advise?)  Preferred Pharmacy (with phone number or street name):[facility] [address]. Pt has new pharm Agent: Please be advised that RX refills may take up to 3 business days. We ask that you follow-up with your pharmacy.

## 2017-11-17 NOTE — Telephone Encounter (Signed)
Rx sent to pharmacy   

## 2017-11-18 ENCOUNTER — Other Ambulatory Visit: Payer: Self-pay

## 2017-11-18 ENCOUNTER — Encounter: Payer: Self-pay | Admitting: Gastroenterology

## 2017-11-18 ENCOUNTER — Encounter: Payer: 59 | Admitting: Gastroenterology

## 2017-11-18 ENCOUNTER — Ambulatory Visit (AMBULATORY_SURGERY_CENTER): Payer: 59 | Admitting: Gastroenterology

## 2017-11-18 VITALS — BP 120/85 | HR 63 | Temp 98.2°F | Resp 14 | Ht 72.0 in | Wt 179.0 lb

## 2017-11-18 DIAGNOSIS — G8929 Other chronic pain: Secondary | ICD-10-CM

## 2017-11-18 DIAGNOSIS — K2970 Gastritis, unspecified, without bleeding: Secondary | ICD-10-CM

## 2017-11-18 DIAGNOSIS — R1013 Epigastric pain: Secondary | ICD-10-CM

## 2017-11-18 MED ORDER — SODIUM CHLORIDE 0.9 % IV SOLN: 500.0000 mL | Freq: Once | INTRAVENOUS | Status: DC

## 2017-11-18 NOTE — Progress Notes (Signed)
Report to RN, VSS, adequate respirations noted, no c/o pain or discomfort 

## 2017-11-18 NOTE — Op Note (Signed)
[facility] Patient Name: Darrell Reid Procedure Date: 11/18/2017 9:46 AM MRN: [id number] Endoscopist: Jackquline Denmark , MD Age: 52 Referring MD:  Date of Birth: Mar 29, 1966 Gender: Male Account #: [id number] Procedure:                Upper GI endoscopy Indications:              Epigastric abdominal pain Medicines:                Monitored Anesthesia Care Procedure:                Pre-Anesthesia Assessment:                           - Prior to the procedure, a History and Physical                            was performed, and patient medications and                            allergies were reviewed. The patient is competent.                            The risks and benefits of the procedure and the                            sedation options and risks were discussed with the                            patient. All questions were answered and informed                            consent was obtained. Patient identification and                            proposed procedure were verified by the physician                            in the procedure room. Mental Status Examination:                            alert and oriented. Prophylactic Antibiotics: The                            patient does not require prophylactic antibiotics.                            Prior Anticoagulants: The patient has taken no                            previous anticoagulant or antiplatelet agents. ASA                            Grade Assessment: I - A normal, healthy patient.  After reviewing the risks and benefits, the patient                            was deemed in satisfactory condition to undergo the                            procedure. The anesthesia plan was to use monitored                            anesthesia care (MAC). Immediately prior to                            administration of medications, the patient was                            re-assessed for  adequacy to receive sedatives. The                            heart rate, respiratory rate, oxygen saturations,                            blood pressure, adequacy of pulmonary ventilation,                            and response to care were monitored throughout the                            procedure. The physical status of the patient was                            re-assessed after the procedure.                           After obtaining informed consent, the endoscope was                            passed under direct vision. Throughout the                            procedure, the patient's blood pressure, pulse, and                            oxygen saturations were monitored continuously. The                            Endoscope was introduced through the mouth, and                            advanced to the second part of duodenum. The upper                            GI endoscopy was accomplished without difficulty.  The patient tolerated the procedure well. Scope In: Scope Out: Findings:                 A small transient hiatal hernia was present.                           Localized minimal inflammation characterized by                            erythema was found in the gastric antrum. Biopsies                            were taken with a cold forceps for histology.                            Estimated blood loss: none. Biopsies for histology                            were taken with a cold forceps from the second                            portion of the duodenum for evaluation of celiac                            disease.                           The exam was otherwise without abnormality. Complications:            No immediate complications. Estimated Blood Loss:     Estimated blood loss: none. Impression:               - Small transient hiatal hernia.                           - Minimal gastritis. Biopsied.                           - The  examination was otherwise normal. Recommendation:           - Patient has a contact number available for                            emergencies. The signs and symptoms of potential                            delayed complications were discussed with the                            patient. Return to normal activities tomorrow.                            Written discharge instructions were provided to the                            patient.                           -  Resume previous diet. Avoid sodas and minimize                            nonsteroidals.                           - Continue present medications. continue Protonix                            40 mg by mouth twice a day for 2 weeks and then                            reduce it to once a day.                           - Await pathology results.                           - Return to GI clinic PRN. If still with problems,                            we will perform further workup.                           - Recommend screening colonoscopy at the age of 52. Jackquline Denmark, MD 11/18/2017 10:13:59 AM This report has been signed electronically.

## 2017-11-18 NOTE — Patient Instructions (Signed)
Discharge instructions given. Handout on a hiatal hernia. Resume previous medications. YOU HAD AN ENDOSCOPIC PROCEDURE TODAY AT [facility]:   Refer to the procedure report that was given to you for any specific questions about what was found during the examination.  If the procedure report does not answer your questions, please call your gastroenterologist to clarify.  If you requested that your care partner not be given the details of your procedure findings, then the procedure report has been included in a sealed envelope for you to review at your convenience later.  YOU SHOULD EXPECT: Some feelings of bloating in the abdomen. Passage of more gas than usual.  Walking can help get rid of the air that was put into your GI tract during the procedure and reduce the bloating. If you had a lower endoscopy (such as a colonoscopy or flexible sigmoidoscopy) you may notice spotting of blood in your stool or on the toilet paper. If you underwent a bowel prep for your procedure, you may not have a normal bowel movement for a few days.  Please Note:  You might notice some irritation and congestion in your nose or some drainage.  This is from the oxygen used during your procedure.  There is no need for concern and it should clear up in a day or so.  SYMPTOMS TO REPORT IMMEDIATELY:    Following upper endoscopy (EGD)  Vomiting of blood or coffee ground material  New chest pain or pain under the shoulder blades  Painful or persistently difficult swallowing  New shortness of breath  Fever of 100F or higher  Black, tarry-looking stools  For urgent or emergent issues, a gastroenterologist can be reached at any hour by calling [phone].   DIET:  We do recommend a small meal at first, but then you may proceed to your regular diet.  Drink plenty of fluids but you should avoid alcoholic beverages for 24 hours.  ACTIVITY:  You should plan to take it easy for the rest of today and you  should NOT DRIVE or use heavy machinery until tomorrow (because of the sedation medicines used during the test).    FOLLOW UP: Our staff will call the number listed on your records the next business day following your procedure to check on you and address any questions or concerns that you may have regarding the information given to you following your procedure. If we do not reach you, we will leave a message.  However, if you are feeling well and you are not experiencing any problems, there is no need to return our call.  We will assume that you have returned to your regular daily activities without incident.  If any biopsies were taken you will be contacted by phone or by letter within the next 1-3 weeks.  Please call us at [phone] if you have not heard about the biopsies in 3 weeks.    SIGNATURES/CONFIDENTIALITY: You and/or your care partner have signed paperwork which will be entered into your electronic medical record.  These signatures attest to the fact that that the information above on your After Visit Summary has been reviewed and is understood.  Full responsibility of the confidentiality of this discharge information lies with you and/or your care-partner.

## 2017-11-18 NOTE — Progress Notes (Signed)
Called to room to assist during endoscopic procedure.  Patient ID and intended procedure confirmed with present staff. Received instructions for my participation in the procedure from the performing physician.  

## 2017-11-19 ENCOUNTER — Telehealth: Payer: Self-pay

## 2017-11-19 NOTE — Telephone Encounter (Signed)
  Follow up Call-  Call back number 11/18/2017  Post procedure Call Back phone  # [phone]  Permission to leave phone message Yes  Some recent data might be hidden     Patient questions:  Do you have a fever, pain , or abdominal swelling? No. Pain Score  0 *  Have you tolerated food without any problems? Yes.    Have you been able to return to your normal activities? Yes.    Do you have any questions about your discharge instructions: Diet   No. Medications  No. Follow up visit  No.  Do you have questions or concerns about your Care? No.  Actions: * If pain score is 4 or above: No action needed, pain <4.

## 2017-11-19 NOTE — Telephone Encounter (Signed)
NO ANSWER, MESSAGE LEFT FOR PATIENT. 

## 2017-11-22 ENCOUNTER — Encounter: Payer: Self-pay | Admitting: Gastroenterology

## 2018-03-08 ENCOUNTER — Encounter: Payer: Self-pay | Admitting: Family Medicine

## 2018-03-08 ENCOUNTER — Ambulatory Visit (INDEPENDENT_AMBULATORY_CARE_PROVIDER_SITE_OTHER): Payer: 59 | Admitting: Family Medicine

## 2018-03-08 VITALS — BP 127/86 | HR 66 | Temp 98.5°F | Resp 16 | Ht 72.0 in | Wt 182.0 lb

## 2018-03-08 DIAGNOSIS — Z0000 Encounter for general adult medical examination without abnormal findings: Secondary | ICD-10-CM | POA: Diagnosis not present

## 2018-03-08 LAB — COMPREHENSIVE METABOLIC PANEL
ALK PHOS: 48 U/L (ref 39–117)
ALT: 16 U/L (ref 0–53)
AST: 14 U/L (ref 0–37)
Albumin: 4.3 g/dL (ref 3.5–5.2)
BILIRUBIN TOTAL: 0.8 mg/dL (ref 0.2–1.2)
BUN: 17 mg/dL (ref 6–23)
CO2: 31 meq/L (ref 19–32)
Calcium: 9.2 mg/dL (ref 8.4–10.5)
Chloride: 105 mEq/L (ref 96–112)
Creatinine, Ser: 1.01 mg/dL (ref 0.40–1.50)
GFR: 83.06 mL/min (ref >60.00)
GLUCOSE: 89 mg/dL (ref 70–99)
Potassium: 3.8 mEq/L (ref 3.5–5.1)
Sodium: 142 mEq/L (ref 135–145)
TOTAL PROTEIN: 6.5 g/dL (ref 6.0–8.3)

## 2018-03-08 LAB — LIPID PANEL
CHOL/HDL RATIO: 4
Cholesterol: 138 mg/dL (ref 0–200)
HDL: 31.1 mg/dL — AB (ref >39.00)
LDL Cholesterol: 78 mg/dL (ref 0–99)
NONHDL: 106.9
TRIGLYCERIDES: 145 mg/dL (ref 0.0–149.0)
VLDL: 29 mg/dL (ref 0.0–40.0)

## 2018-03-08 MED ORDER — PANTOPRAZOLE SODIUM 40 MG PO TBEC: 40.0000 mg | DELAYED_RELEASE_TABLET | Freq: Two times a day (BID) | ORAL | 1 refills | Status: DC
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
## 2018-03-08 NOTE — Patient Instructions (Addendum)
The only lifestyle changes that have data behind them are weight loss for the overweight/obese and elevating the head of the bed. Finding out which foods/positions are triggers is important.  One alternative to the Protonix you could take is Pepcid (famotidine) 20 mg twice daily.   Give [place] 2-3 business days to get the results of your labs back.   Keep the diet clean and stay active.  Call Dr. Lyndel Safe for a colonoscopy. If you need a referral, let me know.   The new Shingrix vaccine (for shingles) is a 2 shot series. It can make people feel low energy, achy and almost like they have the flu for 48 hours after injection. Please plan accordingly when deciding on when to get this shot. Call our office for a nurse visit appointment to get this. The second shot of the series is less severe regarding the side effects, but it still lasts 48 hours.   Let us know if you need anything.

## 2018-03-28 ENCOUNTER — Encounter: Payer: Self-pay | Admitting: Family Medicine

## 2018-03-29 ENCOUNTER — Encounter: Payer: Self-pay | Admitting: Gastroenterology

## 2018-03-30 ENCOUNTER — Other Ambulatory Visit: Payer: Self-pay | Admitting: Family Medicine

## 2018-04-14 ENCOUNTER — Telehealth: Payer: Self-pay

## 2018-04-14 DIAGNOSIS — G8929 Other chronic pain: Secondary | ICD-10-CM

## 2018-04-14 DIAGNOSIS — R1013 Epigastric pain: Principal | ICD-10-CM

## 2018-04-14 NOTE — Telephone Encounter (Signed)
Darrell Reid I routed my response to his my-chart message to you this morning. I can see it under "notes tab" Had copied this to you. I am just wondering why is it not coming to you? am I clicking on the wrong tab?

## 2018-04-14 NOTE — Telephone Encounter (Signed)
Hi Darrell Reid It certainly could be kidney stones Plan: -Lets proceed with ultrasound of the abdomen complete. -Also now you are above the age of 14, and once the above work-up is complete (and when you feel better), proceed with screening colonoscopy.  I will ask Lesly Rubenstein to set you up for ultrasound.

## 2018-04-14 NOTE — Telephone Encounter (Signed)
Per patient MyChart message=Sharp pain that comes and goes between middle of back on left and under left rib cage. Sometimes very tender in a certain spot on back. Could it be kidney stone moving or what? Called and spoke patient-patient reports he was told at his endoscopy that he had a hiatal hernia-patient states he is sore in that area every day that he works-worse recently-takes his Protonix as prescribed (daily)  Left sided discomfort/tender sometimes when it is "messed with-pushed on"-recently moved under his rib cage and up into his shoulder=no changes in discomfort with movement or change in position-has not had any changes in bowel habits (bladder/bowel), has cut out [place].Dew, and diet changed for the better;  Does not remember any time where he pulled/injury to rib cage-icy hot helps a little- cannot lay on left side due to discomfort gets worse- is taking Tylenol for pain relief Patient would like to know if there is anything he can do to alleviate the discomfort?   Please advise (patient is questioning if this could be a kidney stone)=patient states if you wanted to call him so he can discuss things that would be okay

## 2018-04-15 NOTE — Telephone Encounter (Signed)
Called and spoke with patient-patient reports putting "icy-hot on abdomen and has a little bit of relief'; informed patient of MD recommendations and patient is agreeable to plan of care; patient has been scheduled for [place] abd complete on 04/19/18 arrival at 10:15 am for 10:30 am appt; patient is to be NPO after midnight; informed patient of contact for radiology dept [phone] in case it is needed; read patient the MyChart message; patient verbalized understanding of information/instructions; advised patient to call back if questions/conerns arise;

## 2018-04-19 ENCOUNTER — Ambulatory Visit (HOSPITAL_COMMUNITY)
Admission: RE | Admit: 2018-04-19 | Discharge: 2018-04-19 | Disposition: A | Discharge status: Home / Self Care | Payer: 59 | Source: Ambulatory Visit | Attending: Gastroenterology | Admitting: Gastroenterology

## 2018-04-19 ENCOUNTER — Ambulatory Visit (AMBULATORY_SURGERY_CENTER): Payer: Self-pay

## 2018-04-19 ENCOUNTER — Encounter: Payer: Self-pay | Admitting: Gastroenterology

## 2018-04-19 VITALS — Ht 72.0 in | Wt 184.0 lb

## 2018-04-19 DIAGNOSIS — R1013 Epigastric pain: Secondary | ICD-10-CM | POA: Diagnosis not present

## 2018-04-19 DIAGNOSIS — G8929 Other chronic pain: Secondary | ICD-10-CM

## 2018-04-19 DIAGNOSIS — R161 Splenomegaly, not elsewhere classified: Secondary | ICD-10-CM | POA: Insufficient documentation

## 2018-04-19 DIAGNOSIS — D739 Disease of spleen, unspecified: Secondary | ICD-10-CM | POA: Diagnosis not present

## 2018-04-19 DIAGNOSIS — Z1211 Encounter for screening for malignant neoplasm of colon: Secondary | ICD-10-CM

## 2018-04-19 MED ORDER — NA SULFATE-K SULFATE-MG SULF 17.5-3.13-1.6 GM/177ML PO SOLN: 1.0000 | Freq: Once | ORAL | 0 refills | Status: AC

## 2018-04-19 NOTE — Progress Notes (Signed)
No egg or soy allergy known to patient  No issues with past sedation with any surgeries  or procedures, no intubation problems  No diet pills per patient No home 02 use per patient  No blood thinners per patient  Pt denies issues with constipation  No A fib or A flutter  EMMI video sent to pt's e mail. Yes

## 2018-05-03 ENCOUNTER — Ambulatory Visit (AMBULATORY_SURGERY_CENTER): Payer: 59 | Admitting: Gastroenterology

## 2018-05-03 ENCOUNTER — Encounter: Payer: Self-pay | Admitting: Gastroenterology

## 2018-05-03 VITALS — BP 121/82 | HR 64 | Temp 97.0°F | Resp 16 | Ht 72.0 in | Wt 184.0 lb

## 2018-05-03 DIAGNOSIS — D120 Benign neoplasm of cecum: Secondary | ICD-10-CM | POA: Diagnosis not present

## 2018-05-03 DIAGNOSIS — D122 Benign neoplasm of ascending colon: Secondary | ICD-10-CM | POA: Diagnosis not present

## 2018-05-03 DIAGNOSIS — K635 Polyp of colon: Secondary | ICD-10-CM | POA: Diagnosis not present

## 2018-05-03 DIAGNOSIS — Z1211 Encounter for screening for malignant neoplasm of colon: Secondary | ICD-10-CM

## 2018-05-03 MED ORDER — SODIUM CHLORIDE 0.9 % IV SOLN: 500.0000 mL | Freq: Once | INTRAVENOUS | Status: DC

## 2018-05-03 NOTE — Progress Notes (Signed)
Pt's states no medical or surgical changes since previsit or office visit. 

## 2018-05-03 NOTE — Progress Notes (Signed)
Called to room to assist during endoscopic procedure.  Patient ID and intended procedure confirmed with present staff. Received instructions for my participation in the procedure from the performing physician.  

## 2018-05-03 NOTE — Progress Notes (Signed)
To PACU, VSS. Report to [email or web address] 

## 2018-05-03 NOTE — Patient Instructions (Signed)
Thank you for allowing [place] to care for you today!  Await pathology results by mail, approximately 2 weeks.  Recommendation for next colonoscopy will be made at that time.  Resume previous diet and medications today.  Return to normal activities tomorrow.  Polyp and diverticulosis handouts provided.   YOU HAD AN ENDOSCOPIC PROCEDURE TODAY AT [facility]:   Refer to the procedure report that was given to you for any specific questions about what was found during the examination.  If the procedure report does not answer your questions, please call your gastroenterologist to clarify.  If you requested that your care partner not be given the details of your procedure findings, then the procedure report has been included in a sealed envelope for you to review at your convenience later.  YOU SHOULD EXPECT: Some feelings of bloating in the abdomen. Passage of more gas than usual.  Walking can help get rid of the air that was put into your GI tract during the procedure and reduce the bloating. If you had a lower endoscopy (such as a colonoscopy or flexible sigmoidoscopy) you may notice spotting of blood in your stool or on the toilet paper. If you underwent a bowel prep for your procedure, you may not have a normal bowel movement for a few days.  Please Note:  You might notice some irritation and congestion in your nose or some drainage.  This is from the oxygen used during your procedure.  There is no need for concern and it should clear up in a day or so.  SYMPTOMS TO REPORT IMMEDIATELY:   Following lower endoscopy (colonoscopy or flexible sigmoidoscopy):  Excessive amounts of blood in the stool  Significant tenderness or worsening of abdominal pains  Swelling of the abdomen that is new, acute  Fever of 100F or higher   Following upper endoscopy (EGD)  Vomiting of blood or coffee ground material  New chest pain or pain under the shoulder blades  Painful or persistently  difficult swallowing  New shortness of breath  Fever of 100F or higher  Black, tarry-looking stools  For urgent or emergent issues, a gastroenterologist can be reached at any hour by calling [phone].   DIET:  We do recommend a small meal at first, but then you may proceed to your regular diet.  Drink plenty of fluids but you should avoid alcoholic beverages for 24 hours.  ACTIVITY:  You should plan to take it easy for the rest of today and you should NOT DRIVE or use heavy machinery until tomorrow (because of the sedation medicines used during the test).    FOLLOW UP: Our staff will call the number listed on your records the next business day following your procedure to check on you and address any questions or concerns that you may have regarding the information given to you following your procedure. If we do not reach you, we will leave a message.  However, if you are feeling well and you are not experiencing any problems, there is no need to return our call.  We will assume that you have returned to your regular daily activities without incident.  If any biopsies were taken you will be contacted by phone or by letter within the next 1-3 weeks.  Please call us at [phone] if you have not heard about the biopsies in 3 weeks.    SIGNATURES/CONFIDENTIALITY: You and/or your care partner have signed paperwork which will be entered into your electronic medical record.  These signatures attest to the fact that that the information above on your After Visit Summary has been reviewed and is understood.  Full responsibility of the confidentiality of this discharge information lies with you and/or your care-partner.

## 2018-05-03 NOTE — Op Note (Addendum)
[place] Patient Name: Darrell Reid Procedure Date: 05/03/2018 1:25 PM MRN: [id number] Endoscopist: Jackquline Denmark , MD Age: 52 Referring MD:  Date of Birth: February 03, 1966 Gender: Male Account #: [id number] Procedure:                Colonoscopy Indications:              Screening for colorectal malignant neoplasm Medicines:                Monitored Anesthesia Care Procedure:                Pre-Anesthesia Assessment:                           - Prior to the procedure, a History and Physical                            was performed, and patient medications and                            allergies were reviewed. The patient's tolerance of                            previous anesthesia was also reviewed. The risks                            and benefits of the procedure and the sedation                            options and risks were discussed with the patient.                            All questions were answered, and informed consent                            was obtained. Prior Anticoagulants: The patient has                            taken no previous anticoagulant or antiplatelet                            agents. ASA Grade Assessment: I - A normal, healthy                            patient. After reviewing the risks and benefits,                            the patient was deemed in satisfactory condition to                            undergo the procedure.                           After obtaining informed consent, the colonoscope  was passed under direct vision. Throughout the                            procedure, the patient's blood pressure, pulse, and                            oxygen saturations were monitored continuously. The                            Colonoscope was introduced through the anus and                            advanced to the 2 cm into the ileum. The                            colonoscopy was performed without  difficulty. The                            patient tolerated the procedure well. The quality                            of the bowel preparation was excellent. Scope In: 1:33:07 PM Scope Out: 1:54:34 PM Scope Withdrawal Time: 0 hours 17 minutes 49 seconds  Total Procedure Duration: 0 hours 21 minutes 27 seconds  Findings:                 Four sessile polyps were found in the ascending                            colon and cecum. The polyps were 2 to 4 mm in size.                            These polyps were removed with a cold snare.                            Resection and retrieval were complete. Estimated                            blood loss: none.                           A 8 mm polyp was found in the distal ascending                            colon. The polyp was sessile. The polyp was removed                            with a hot snare. Resection and retrieval were                            complete. Estimated blood loss: none.  Multiple small-mouthed diverticula were found in                            the sigmoid colon and descending colon.                           The terminal ileum appeared normal.                           The exam was otherwise without abnormality on                            direct and retroflexion views. Complications:            No immediate complications. Estimated Blood Loss:     Estimated blood loss: none. Impression:               -Colonic polyps status post polypectomy.                           -Mild left colonic diverticulosis.                           -Otherwise normal colonoscopy to TI. Recommendation:           - Patient has a contact number available for                            emergencies. The signs and symptoms of potential                            delayed complications were discussed with the                            patient. Return to normal activities tomorrow.                            Written  discharge instructions were provided to the                            patient.                           - High fiber diet.                           - Continue present medications.                           - Await pathology results.                           - Repeat colonoscopy for surveillance based on                            pathology results.                           -  Return to GI office PRN. Jackquline Denmark, MD 05/03/2018 2:00:45 PM This report has been signed electronically.

## 2018-05-04 ENCOUNTER — Telehealth: Payer: Self-pay | Admitting: *Deleted

## 2018-05-04 NOTE — Telephone Encounter (Signed)
  Follow up Call-  Call back number 05/03/2018 11/18/2017  Post procedure Call Back phone  # [phone]  Permission to leave phone message Yes Yes  Some recent data might be hidden     Patient questions:  Do you have a fever, pain , or abdominal swelling? No. Pain Score  0 *  Have you tolerated food without any problems? Yes.    Have you been able to return to your normal activities? No.  Do you have any questions about your discharge instructions: Diet   No. Medications  No. Follow up visit  No.  Do you have questions or concerns about your Care? No.  Actions: * If pain score is 4 or above: No action needed, pain <4.

## 2018-05-13 NOTE — Telephone Encounter (Signed)
Patient has sent in a MyChart message requesting information regarding his pathology results from his colonoscopy; please advise

## 2018-05-16 ENCOUNTER — Encounter: Payer: Self-pay | Admitting: Family Medicine

## 2018-05-17 NOTE — Telephone Encounter (Signed)
Called and spoke with patient-patient reports "everything is working like I think It is supposed to be-kinda back to normal"; patient denies having any issues at this time; Patient verbalized understanding of information/instructions; Patient was advised to call back if questions/concerns arise;

## 2018-05-17 NOTE — Telephone Encounter (Signed)
Bre He had extensive GI work-up done-including recent colonoscopy. Can you please call him and see how he is doing.  Just for follow-up Thanks Merrie Roof

## 2018-05-18 ENCOUNTER — Encounter: Payer: Self-pay | Admitting: Gastroenterology

## 2018-05-18 NOTE — Telephone Encounter (Signed)
Thanks for letting me know!

## 2018-09-07 MED ORDER — PANTOPRAZOLE SODIUM 40 MG PO TBEC: 40.0000 mg | DELAYED_RELEASE_TABLET | Freq: Every day | ORAL | 3 refills | Status: DC

## 2018-09-15 ENCOUNTER — Telehealth: Payer: 59 | Admitting: Nurse Practitioner

## 2018-09-15 DIAGNOSIS — J029 Acute pharyngitis, unspecified: Secondary | ICD-10-CM

## 2018-09-15 NOTE — Progress Notes (Signed)
We are sorry that you are not feeling well.  Here is how we plan to help!  Your symptoms indicate a likely viral infection (Pharyngitis).   Pharyngitis is inflammation in the back of the throat which can cause a sore throat, scratchiness and sometimes difficulty swallowing.   Pharyngitis is typically caused by a respiratory virus and will just run its course.  Please keep in mind that your symptoms could last up to 10 days.  For throat pain, we recommend over the counter oral pain relief medications such as acetaminophen or aspirin, or anti-inflammatory medications such as ibuprofen or naproxen sodium.  Topical treatments such as oral throat lozenges or sprays may be used as needed.  Avoid close contact with loved ones, especially the very young and elderly.  Remember to wash your hands thoroughly throughout the day as this is the number one way to prevent the spread of infection and wipe down door knobs and counters with disinfectant.  After careful review of your answers, I would not recommend and antibiotic for your condition.  Antibiotics should not be used to treat conditions that we suspect are caused by viruses like the virus that causes the common cold or flu. However, some people can have Strep with atypical symptoms. You may need formal testing in clinic or office to confirm if your symptoms continue or worsen.  Providers prescribe antibiotics to treat infections caused by bacteria. Antibiotics are very powerful in treating bacterial infections when they are used properly.  To maintain their effectiveness, they should be used only when necessary.  Overuse of antibiotics has resulted in the development of super bugs that are resistant to treatment!    Home Care:  Only take medications as instructed by your medical team.  Do not drink alcohol while taking these medications.  A steam or ultrasonic humidifier can help congestion.  You can place a towel over your head and breathe in the steam from  hot water coming from a faucet.  Avoid close contacts especially the very young and the elderly.  Cover your mouth when you cough or sneeze.  Always remember to wash your hands.  Get Help Right Away If:  You develop worsening fever or throat pain.  You develop a severe head ache or visual changes.  Your symptoms persist after you have completed your treatment plan.  Make sure you  Understand these instructions.  Will watch your condition.  Will get help right away if you are not doing well or get worse.  Your e-visit answers were reviewed by a board certified advanced clinical practitioner to complete your personal care plan.  Depending on the condition, your plan could have included both over the counter or prescription medications.  If there is a problem please reply  once you have received a response from your provider.  Your safety is important to [place].  If you have drug allergies check your prescription carefully.    You can use MyChart to ask questions about todays visit, request a non-urgent call back, or ask for a work or school excuse for 24 hours related to this e-Visit. If it has been greater than 24 hours you will need to follow up with your provider, or enter a new e-Visit to address those concerns.  You will get an e-mail in the next two days asking about your experience.  I hope that your e-visit has been valuable and will speed your recovery. Thank you for using e-visits.   E-Visit for [employer]  Screening  Based on your current symptoms, it seems unlikely that your symptoms are related to the Coronavirus.   You have been enrolled in [place] for COVID-19. Daily you will receive a questionnaire within the [facility] website. Our COVID-19 response team will be monitoring your responses daily.   Coronavirus disease 2019 (COVID-19) is a respiratory illness that can spread from person to person. The virus that causes COVID-19 is a new virus that was  first identified in the country of [place] but is now found in multiple other countries and has spread to the [place].  Symptoms associated with the virus are mild to severe fever, cough, and shortness of breath. There is currently no vaccine to protect against COVID-19, and there is no specific antiviral treatment for the virus.   To be considered HIGH RISK for Coronavirus (COVID-19), you have to meet the following criteria:  . Traveled to [place], [place], [place], [place] or [place]; or in the [place] to [place], [place], [place], or [place]; and have fever, cough, and shortness of breath within the last 2 weeks of travel OR  . Been in close contact with a person diagnosed with COVID-19 within the last 2 weeks and have fever, cough, and shortness of breath  . IF YOU DO NOT MEET THESE CRITERIA, YOU ARE CONSIDERED LOW RISK FOR COVID-19.   It is vitally important that if you feel that you have an infection such as this virus or any other virus that you stay home and away from places where you may spread it to others.  You should self-quarantine for 14 days if you have symptoms that could potentially be coronavirus and avoid contact with people age 51 and older.   You can use medication such as delsym or mucinex OTC if cough develops.  You may also take acetaminophen (Tylenol) as needed for fever.   Reduce your risk of any infection by using the same precautions used for avoiding the common cold or flu:  Marland Kitchen Wash your hands often with soap and warm water for at least 20 seconds.  If soap and water are not readily available, use an alcohol-based hand sanitizer with at least 60% alcohol.  . If coughing or sneezing, cover your mouth and nose by coughing or sneezing into the elbow areas of your shirt or coat, into a tissue or into your sleeve (not your hands). . Avoid shaking hands with others and consider head nods or verbal greetings only. . Avoid touching your eyes, nose, or mouth with  unwashed hands.  . Avoid close contact with people who are sick. . Avoid places or events with large numbers of people in one location, like concerts or sporting events. . Carefully consider travel plans you have or are making. . If you are planning any travel outside or inside the [place], visit the CDC's Travelers' Health webpage for the latest health notices. . If you have some symptoms but not all symptoms, continue to monitor at home and seek medical attention if your symptoms worsen. . If you are having a medical emergency, call 911.  HOME CARE . Only take medications as instructed by your medical team. . Drink plenty of fluids and get plenty of rest. . A steam or ultrasonic humidifier can help if you have congestion.   GET HELP RIGHT AWAY IF: . You develop worsening fever. . You become short of breath . You cough up blood. . Your symptoms become more severe MAKE SURE  YOU   Understand these instructions.  Will watch your condition.  Will get help right away if you are not doing well or get worse.  Your e-visit answers were reviewed by a board certified advanced clinical practitioner to complete your personal care plan.  Depending on the condition, your plan could have included both over the counter or prescription medications.  If there is a problem please reply once you have received a response from your provider. Your safety is important to [place].  If you have drug allergies check your prescription carefully.    You can use MyChart to ask questions about today's visit, request a non-urgent call back, or ask for a work or school excuse for 24 hours related to this e-Visit. If it has been greater than 24 hours you will need to follow up with your provider, or enter a new e-Visit to address those concerns. You will get an e-mail in the next two days asking about your experience.  I hope that your e-visit has been valuable and will speed your recovery. Thank you for using e-visits.  5-10  minutes spent reviewing and documenting in chart.

## 2018-09-19 ENCOUNTER — Encounter: Payer: Self-pay | Admitting: Family Medicine

## 2018-09-22 ENCOUNTER — Telehealth: Payer: Self-pay | Admitting: Gastroenterology

## 2018-09-22 ENCOUNTER — Other Ambulatory Visit: Payer: Self-pay

## 2018-09-22 MED ORDER — PANTOPRAZOLE SODIUM 40 MG PO TBEC: 40.0000 mg | DELAYED_RELEASE_TABLET | Freq: Two times a day (BID) | ORAL | 6 refills | Status: DC

## 2018-09-22 NOTE — Telephone Encounter (Signed)
Have discussed in detail with Darrell Reid. Has been having burning in the throat, water brash Was more prominent after eating [place] food Has been compliant with medications  Plan: -Increase Protonix to 40 mg p.o. twice daily (#60, 6 refills).  Please call this in to [place] at [facility].  If better in 2 weeks, can reduce it to once a day.  -If still with problems, add Pepcid 20 mg p.o. nightly.

## 2018-09-23 NOTE — Telephone Encounter (Signed)
Called patient again today, since he had not returned my call. Dr. Lyndel Safe had responded to his call yesterday and addressed the issues.

## 2018-10-05 ENCOUNTER — Other Ambulatory Visit: Payer: Self-pay

## 2018-10-05 ENCOUNTER — Telehealth (INDEPENDENT_AMBULATORY_CARE_PROVIDER_SITE_OTHER): Payer: 59 | Admitting: Gastroenterology

## 2018-10-05 ENCOUNTER — Encounter: Payer: Self-pay | Admitting: Gastroenterology

## 2018-10-05 DIAGNOSIS — Z86010 Personal history of colonic polyps: Secondary | ICD-10-CM | POA: Diagnosis not present

## 2018-10-05 DIAGNOSIS — K219 Gastro-esophageal reflux disease without esophagitis: Secondary | ICD-10-CM

## 2018-10-05 MED ORDER — PANTOPRAZOLE SODIUM 40 MG PO TBEC: 40.0000 mg | DELAYED_RELEASE_TABLET | Freq: Every day | ORAL | 6 refills | Status: DC

## 2018-10-05 NOTE — Patient Instructions (Signed)
If you are age 51 or older, your body mass index should be between 23-30. Your Body mass index is 25.09 kg/m. If this is out of the aforementioned range listed, please consider follow up with your Primary Care Provider.  If you are age 83 or younger, your body mass index should be between 19-25. Your Body mass index is 25.09 kg/m. If this is out of the aformentioned range listed, please consider follow up with your Primary Care Provider.   We have sent the following medications to your pharmacy for you to pick up at your convenience: Prootnix 40 mg once daily, may reduce to every other day in 2 weeks.   You will be due for a recall colonoscopy in 04/2021. We will send you a reminder in the mail when it gets closer to that time.   Food Choices for Gastroesophageal Reflux Disease, Adult When you have gastroesophageal reflux disease (GERD), the foods you eat and your eating habits are very important. Choosing the right foods can help ease your discomfort. Think about working with a nutrition specialist (dietitian) to help you make good choices. What are tips for following this plan?  Meals  Choose healthy foods that are low in fat, such as fruits, vegetables, whole grains, low-fat dairy products, and lean meat, fish, and poultry.  Eat small meals often instead of 3 large meals a day. Eat your meals slowly, and in a place where you are relaxed. Avoid bending over or lying down until 2-3 hours after eating.  Avoid eating meals 2-3 hours before bed.  Avoid drinking a lot of liquid with meals.  Cook foods using methods other than frying. Bake, grill, or broil food instead.  Avoid or limit: ? Chocolate. ? Peppermint or spearmint. ? Alcohol. ? Pepper. ? Black and decaffeinated coffee. ? Black and decaffeinated tea. ? Bubbly (carbonated) soft drinks. ? Caffeinated energy drinks and soft drinks.  Limit high-fat foods such as: ? Fatty meat or fried foods. ? Whole milk, cream, butter, or  ice cream. ? Nuts and nut butters. ? Pastries, donuts, and sweets made with butter or shortening.  Avoid foods that cause symptoms. These foods may be different for everyone. Common foods that cause symptoms include: ? Tomatoes. ? Oranges, lemons, and limes. ? Peppers. ? Spicy food. ? Onions and garlic. ? Vinegar. Lifestyle  Maintain a healthy weight. Ask your doctor what weight is healthy for you. If you need to lose weight, work with your doctor to do so safely.  Exercise for at least 30 minutes for 5 or more days each week, or as told by your doctor.  Wear loose-fitting clothes.  Do not smoke. If you need help quitting, ask your doctor.  Sleep with the head of your bed higher than your feet. Use a wedge under the mattress or blocks under the bed frame to raise the head of the bed. Summary  When you have gastroesophageal reflux disease (GERD), food and lifestyle choices are very important in easing your symptoms.  Eat small meals often instead of 3 large meals a day. Eat your meals slowly, and in a place where you are relaxed.  Limit high-fat foods such as fatty meat or fried foods.  Avoid bending over or lying down until 2-3 hours after eating.  Avoid peppermint and spearmint, caffeine, alcohol, and chocolate. This information is not intended to replace advice given to you by your health care provider. Make sure you discuss any questions you have with your health care  provider. Document Released: 10/28/2011 Document Revised: 06/03/2016 Document Reviewed: 06/03/2016 Elsevier Interactive Patient Education  [employer].    Follow up in 6 months.  Thank you,  Dr. Jackquline Denmark
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
## 2018-10-08 NOTE — Telephone Encounter (Signed)
For now, let us increase Protonix to 40 mg p.o. twice daily x 4 weeks, the QD.  Please call in a prescription so that he has enough (#60, 4 refills) Should get better in 3 to 4 days If he still has problems, will give him GI cocktail  Dr Lyndel Safe

## 2018-10-13 ENCOUNTER — Telehealth: Payer: Self-pay | Admitting: *Deleted

## 2018-10-13 NOTE — Telephone Encounter (Signed)
Called the patient to follow up MyChart message the patient wrote on 5/29 as it was responded to by Dr. Lyndel Safe today. Left a message for the patient to call back if he still needed questions answered.

## 2018-10-18 ENCOUNTER — Encounter: Payer: Self-pay | Admitting: Family Medicine

## 2018-10-20 ENCOUNTER — Encounter: Payer: Self-pay | Admitting: Family Medicine

## 2018-10-20 ENCOUNTER — Ambulatory Visit (INDEPENDENT_AMBULATORY_CARE_PROVIDER_SITE_OTHER): Payer: 59 | Admitting: Family Medicine

## 2018-10-20 DIAGNOSIS — R2681 Unsteadiness on feet: Secondary | ICD-10-CM

## 2018-10-20 DIAGNOSIS — I951 Orthostatic hypotension: Secondary | ICD-10-CM | POA: Diagnosis not present
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
## 2018-11-02 ENCOUNTER — Telehealth: Payer: Self-pay | Admitting: Gastroenterology

## 2018-11-02 NOTE — Telephone Encounter (Signed)
Pt states that he has been taking Pantoprazole since last year and his sxs has gotten better but not 100% yet. He wants to know if Dr. Lyndel Safe could prescribe something else on top of pantoprazole. Pls call him.

## 2018-11-02 NOTE — Telephone Encounter (Signed)
Spoke with pt and he is taking the protonix but is still having some burning in the lower part of his stomach that at times goes up into his chest. He does not have a cough but states that sometimes it feels like the gerd is causing issues with his lungs. States it almost feels like bronchitis like he cannot get a good deep breath. Today he has reported some dizziness. He wonders if he needs a different med or something added. He had an episode the other day where he thought he was having cardiac issues but states Dr. Lyndel Safe stated it may have been an esophageal spasm. Pt wants to know what Dr. Lyndel Safe recommends. Please advise.

## 2018-11-02 NOTE — Telephone Encounter (Signed)
Change Protonix to Dexilant 60 mg p.o. once a day #30, 6 refills.  Please give cards and coupons HIDA scan with ejection fraction to rule out biliary dyskinesia  GI cocktail (equal amounts of Maalox, liquid Donnatal or Bentyl and viscous lidocaine) 5 cc po q8hrs prn (#121ml). 1 refill.  Needs speciality pharmacy near [facility] please  Thx  RG

## 2018-11-03 ENCOUNTER — Other Ambulatory Visit: Payer: Self-pay

## 2018-11-03 DIAGNOSIS — G8929 Other chronic pain: Secondary | ICD-10-CM

## 2018-11-03 DIAGNOSIS — R1013 Epigastric pain: Secondary | ICD-10-CM

## 2018-11-03 MED ORDER — DEXLANSOPRAZOLE 60 MG PO CPDR: 60.0000 mg | DELAYED_RELEASE_CAPSULE | Freq: Every day | ORAL | 3 refills | Status: DC

## 2018-11-03 NOTE — Telephone Encounter (Signed)
Spoke with pt and he is aware. Scripts sent to pharmacy. Hida scan scheduled. Pt aware.

## 2018-11-03 NOTE — Telephone Encounter (Signed)
HIDA scheduled at [facility] 11/19/18@7 :30am, pt to arrive there eat 7:15am and be NPO after midnight. Pt aware.  Pt would like Dr. Lyndel Safe to call him at his convenience, he has something to discuss with him. Dr. Lyndel Safe aware.

## 2018-11-19 ENCOUNTER — Other Ambulatory Visit: Payer: Self-pay

## 2018-11-19 ENCOUNTER — Ambulatory Visit (HOSPITAL_COMMUNITY)
Admission: RE | Admit: 2018-11-19 | Discharge: 2018-11-19 | Disposition: A | Discharge status: Home / Self Care | Payer: 59 | Source: Ambulatory Visit | Attending: Gastroenterology | Admitting: Gastroenterology

## 2018-11-19 DIAGNOSIS — R1013 Epigastric pain: Secondary | ICD-10-CM | POA: Insufficient documentation

## 2018-11-19 DIAGNOSIS — G8929 Other chronic pain: Secondary | ICD-10-CM | POA: Insufficient documentation

## 2018-11-19 MED ORDER — TECHNETIUM TC 99M MEBROFENIN IV KIT
5.4000 | PACK | Freq: Once | INTRAVENOUS | Status: AC | PRN
  Administered 2018-11-19: 5.4 via INTRAVENOUS

## 2018-12-02 ENCOUNTER — Encounter: Payer: Self-pay | Admitting: Family Medicine

## 2019-01-31 ENCOUNTER — Encounter: Payer: Self-pay | Admitting: Gastroenterology

## 2019-01-31 DIAGNOSIS — G8929 Other chronic pain: Secondary | ICD-10-CM

## 2019-01-31 DIAGNOSIS — R1013 Epigastric pain: Secondary | ICD-10-CM

## 2019-01-31 DIAGNOSIS — K219 Gastro-esophageal reflux disease without esophagitis: Secondary | ICD-10-CM

## 2019-01-31 MED ORDER — DEXLANSOPRAZOLE 30 MG PO CPDR: 30.0000 mg | DELAYED_RELEASE_CAPSULE | Freq: Every day | ORAL | 11 refills | Status: DC

## 2019-01-31 NOTE — Telephone Encounter (Signed)
I believe he is on Dexilant 60 once a day  We can cut it down to Dexilant 30 PO once a day, 30, 11 refills If his symptoms are well controlled, we will stay with that for now   RG

## 2019-01-31 NOTE — Telephone Encounter (Signed)
Called and spoke with patient-patient informed of RX being sent to pharmacy of patient choice; patient reports he will start taking the Dexilant 30 mg as prescribed by Dr. Lyndel Safe; patient advised to call back to the office should questions/concerns arise; patient will also call the office to give update on symptoms with taking Dexilant 30 mg daily; patient verbalized understanding of information/instructions;

## 2019-03-02 ENCOUNTER — Telehealth: Payer: Self-pay | Admitting: Family Medicine

## 2019-03-02 NOTE — Telephone Encounter (Signed)
LVM for pt to reschedule appt on 03-10-2019 for cpe, provider not in the office, appt needs to be reschedul.

## 2019-03-10 ENCOUNTER — Other Ambulatory Visit: Payer: Self-pay

## 2019-03-10 ENCOUNTER — Encounter: Payer: 59 | Admitting: Family Medicine

## 2019-03-11 ENCOUNTER — Other Ambulatory Visit: Payer: Self-pay

## 2019-03-11 ENCOUNTER — Ambulatory Visit (INDEPENDENT_AMBULATORY_CARE_PROVIDER_SITE_OTHER): Payer: 59 | Admitting: Family Medicine

## 2019-03-11 ENCOUNTER — Encounter: Payer: Self-pay | Admitting: Family Medicine

## 2019-03-11 VITALS — BP 118/84 | HR 72 | Temp 97.7°F | Ht 72.0 in | Wt 188.5 lb

## 2019-03-11 DIAGNOSIS — Z0000 Encounter for general adult medical examination without abnormal findings: Secondary | ICD-10-CM

## 2019-03-11 NOTE — Patient Instructions (Addendum)
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
## 2019-03-12 LAB — LIPID PANEL
Cholesterol: 159 mg/dL (ref <200)
HDL: 38 mg/dL — ABNORMAL LOW (ref >=40)
LDL Cholesterol (Calc): 85 mg/dL (calc)
Non-HDL Cholesterol (Calc): 121 mg/dL (calc) (ref <130)
Total CHOL/HDL Ratio: 4.2 (calc) (ref <5.0)
Triglycerides: 300 mg/dL — ABNORMAL HIGH (ref <150)

## 2019-03-12 LAB — COMPREHENSIVE METABOLIC PANEL
AG Ratio: 2 (calc) (ref 1.0–2.5)
ALT: 16 U/L (ref 9–46)
AST: 18 U/L (ref 10–35)
Albumin: 4.5 g/dL (ref 3.6–5.1)
Alkaline phosphatase (APISO): 49 U/L (ref 35–144)
BUN: 14 mg/dL (ref 7–25)
CO2: 28 mmol/L (ref 20–32)
Calcium: 9.5 mg/dL (ref 8.6–10.3)
Chloride: 105 mmol/L (ref 98–110)
Creat: 0.93 mg/dL (ref 0.70–1.33)
Globulin: 2.2 g/dL (calc) (ref 1.9–3.7)
Glucose, Bld: 76 mg/dL (ref 65–99)
Potassium: 3.9 mmol/L (ref 3.5–5.3)
Sodium: 141 mmol/L (ref 135–146)
Total Bilirubin: 0.5 mg/dL (ref 0.2–1.2)
Total Protein: 6.7 g/dL (ref 6.1–8.1)

## 2019-03-12 LAB — CBC
HCT: 44.8 % (ref 38.5–50.0)
Hemoglobin: 15.7 g/dL (ref 13.2–17.1)
MCH: 30.7 pg (ref 27.0–33.0)
MCHC: 35 g/dL (ref 32.0–36.0)
MCV: 87.7 fL (ref 80.0–100.0)
MPV: 11.8 fL (ref 7.5–12.5)
Platelets: 210 10*3/uL (ref 140–400)
RBC: 5.11 10*6/uL (ref 4.20–5.80)
RDW: 13.2 % (ref 11.0–15.0)
WBC: 6.4 10*3/uL (ref 3.8–10.8)

## 2019-05-09 ENCOUNTER — Telehealth: Payer: Self-pay | Admitting: Gastroenterology

## 2019-05-09 DIAGNOSIS — R1013 Epigastric pain: Secondary | ICD-10-CM

## 2019-05-09 DIAGNOSIS — G8929 Other chronic pain: Secondary | ICD-10-CM

## 2019-05-09 DIAGNOSIS — K219 Gastro-esophageal reflux disease without esophagitis: Secondary | ICD-10-CM

## 2019-05-09 MED ORDER — DEXLANSOPRAZOLE 30 MG PO CPDR: 30.0000 mg | DELAYED_RELEASE_CAPSULE | Freq: Every day | ORAL | 3 refills | Status: DC

## 2019-05-09 NOTE — Telephone Encounter (Signed)
Please call in Dexilant 30 mg p.o. once a day, 90, 4 refills  Thx  RG

## 2019-05-09 NOTE — Telephone Encounter (Signed)
I have sent prescription to patient's pharmacy.  

## 2019-07-20 DIAGNOSIS — K219 Gastro-esophageal reflux disease without esophagitis: Secondary | ICD-10-CM

## 2019-07-20 DIAGNOSIS — G8929 Other chronic pain: Secondary | ICD-10-CM

## 2019-07-20 DIAGNOSIS — R1013 Epigastric pain: Secondary | ICD-10-CM

## 2019-07-20 MED ORDER — DEXLANSOPRAZOLE 30 MG PO CPDR: 30.0000 mg | DELAYED_RELEASE_CAPSULE | Freq: Every day | ORAL | 4 refills | Status: DC

## 2019-07-20 NOTE — Telephone Encounter (Signed)
Please call in Dexilant 30 mg p.o. once a day, #90, 4 refills Please give her cards/coupons  RG

## 2019-08-29 ENCOUNTER — Telehealth: Payer: Self-pay

## 2019-08-29 NOTE — Telephone Encounter (Signed)
Opened in error

## 2019-09-11 NOTE — Telephone Encounter (Signed)
He and his daughter likely have pinworms  Plan: -Albendazole 400 mg p.o. x1, then repeat in 2 weeks  Please call in 4 tablets 2 for him and 2 for his daughter  Thx  RG

## 2019-09-12 ENCOUNTER — Other Ambulatory Visit: Payer: Self-pay

## 2019-09-12 MED ORDER — ALBENDAZOLE 200 MG PO TABS: ORAL_TABLET | ORAL | 0 refills | Status: DC

## 2019-09-20 ENCOUNTER — Ambulatory Visit: Payer: 59 | Admitting: Family Medicine

## 2019-09-20 ENCOUNTER — Encounter: Payer: Self-pay | Admitting: Family Medicine

## 2019-09-20 ENCOUNTER — Telehealth: Payer: Self-pay | Admitting: Gastroenterology

## 2019-09-20 ENCOUNTER — Telehealth: Payer: Self-pay | Admitting: Family Medicine

## 2019-09-20 ENCOUNTER — Other Ambulatory Visit: Payer: Self-pay

## 2019-09-20 VITALS — BP 120/84 | HR 61 | Temp 96.2°F | Ht 72.0 in | Wt 186.0 lb

## 2019-09-20 DIAGNOSIS — K5900 Constipation, unspecified: Secondary | ICD-10-CM

## 2019-09-20 DIAGNOSIS — R1031 Right lower quadrant pain: Secondary | ICD-10-CM | POA: Diagnosis not present

## 2019-09-20 DIAGNOSIS — M722 Plantar fascial fibromatosis: Secondary | ICD-10-CM

## 2019-09-20 NOTE — Progress Notes (Signed)
Chief Complaint  Patient presents with  . Abdominal Pain    Subjective: Patient is a 54 y.o. male here for lower abd pain.  The pain started on the left side he days ago when he was playing cards with friends.  It was relieved by a bowel movement as it did feel like gas pains.  Stools have been loose overall.  He has been changing his diet for the better.  He has intermittent nausea and vomiting but that is more related to his hiatal hernia.  There is no bruising, swelling, or bulges in the groin region.  He has tried ibuprofen and Tylenol at home with minimal relief.  Patient has a several month history of bilateral plantar fasciitis worse than the left.  It is most painful when he takes his first steps in the morning.  He has inserts specifically for plantar fasciitis.  He has never done any rehab exercises for it.  Past Medical History:  Diagnosis Date  . Fractured hand    left hand 2002  . GERD (gastroesophageal reflux disease)     Objective: BP 120/84 (BP Location: Left Arm, Patient Position: Sitting, Cuff Size: Normal)   Pulse 61   Temp (!) 96.2 F (35.7 C) (Temporal)   Ht 6' (1.829 m)   Wt 186 lb (84.4 kg)   SpO2 93%   BMI 25.23 kg/m  General: Awake, appears stated age HEENT: MMM, EOMi Heart: RRR GI: Bowel sounds present, soft, nontender, nondistended, no masses or organomegaly GU: There is tenderness to palpation with digital insertion into the right inguinal canal, there is no bulging; unremarkable examination of the left inguinal canal Lungs: CTAB, no rales, wheezes or rhonchi. No accessory muscle use MSK: Negative Stinchfield bilaterally Psych: Age appropriate judgment and insight, normal affect and mood  Assessment and Plan: Right inguinal pain - Plan: US Abdomen Limited  Constipation, unspecified constipation type  Plantar fasciitis  1-question whether there is fat trapped in the inguinal canal on the right.  Check ultrasound. 2-increase hydration.  Consider  MiraLAX or Metamucil.  I suspect this is contributing to his right lower abdominal issue. 3-stretches and exercises, ice, [place] sock, power step insoles.  Offered injection which he can get at any time. The patient voiced understanding and agreement to the plan.  [place], DO 09/20/19  3:22 PM

## 2019-09-20 NOTE — Telephone Encounter (Signed)
Patient is calling- stating that he has had an uncomfortable weekend with the hiatal hernia. Also having pain in lower abdomen. Would like to speak with you about it.

## 2019-09-20 NOTE — Patient Instructions (Addendum)
Increase water and fiber intake. Consider MiraLAX.   Someone will reach out regarding your ultrasound.  Ice/cold pack over area for 10-15 min twice daily.  Consider a [place] sock for nighttime use. Consider Power Step insoles.    Plantar Fasciitis Stretches/exercises Do exercises exactly as told by your health care provider and adjust them as directed. It is normal to feel mild stretching, pulling, tightness, or discomfort as you do these exercises, but you should stop right away if you feel sudden pain or your pain gets worse.   Stretching and range of motion exercises These exercises warm up your muscles and joints and improve the movement and flexibility of your foot. These exercises also help to relieve pain.  Exercise A: Plantar fascia stretch 1. Sit with your left / right leg crossed over your opposite knee. 2. Hold your heel with one hand with that thumb near your arch. With your other hand, hold your toes and gently pull them back toward the top of your foot. You should feel a stretch on the bottom of your toes or your foot or both. 3. Hold this stretch for 30 seconds. 4. Slowly release your toes and return to the starting position. Repeat 2 times. Complete this exercise 3 times per week.  Exercise B: Gastroc, standing 1. Stand with your hands against a wall. 2. Extend your left / right leg behind you, and bend your front knee slightly. 3. Keeping your heels on the floor and keeping your back knee straight, shift your weight toward the wall without arching your back. You should feel a gentle stretch in your left / right calf. 4. Hold this position for 30 seconds. Repeat 2 times. Complete this exercise 3 times a week. Exercise C: Soleus, standing 1. Stand with your hands against a wall. 2. Extend your left / right leg behind you, and bend your front knee slightly. 3. Keeping your heels on the floor, bend your back knee and slightly shift your weight over the back leg. You  should feel a gentle stretch deep in your calf. 4. Hold this position for 30 seconds. Repeat 2 times. Complete this exercise 3 times per week. Exercise D: Gastrocsoleus, standing 1. Stand with the ball of your left / right foot on a step. The ball of your foot is on the walking surface, right under your toes. 2. Keep your other foot firmly on the same step. 3. Hold onto the wall or a railing for balance. 4. Slowly lift your other foot, allowing your body weight to press your heel down over the edge of the step. You should feel a stretch in your left / right calf. 5. Hold this position for 30 seconds. 6. Return both feet to the step. 7. Repeat this exercise with a slight bend in your left / right knee. Repeat 2 times with your left / right knee straight and 2times with your left / right knee bent. Complete this exercise 3 times a week.  Balance exercise This exercise builds your balance and strength control of your arch to help take pressure off your plantar fascia. Exercise E: Single leg stand 1. Without shoes, stand near a railing or in a doorway. You may hold onto the railing or door frame as needed. 2. Stand on your left / right foot. Keep your big toe down on the floor and try to keep your arch lifted. Do not let your foot roll inward. 3. Hold this position for 30 seconds. 4. If this exercise  is too easy, you can try it with your eyes closed or while standing on a pillow. Repeat 2 times. Complete this exercise 3 times per week. This information is not intended to replace advice given to you by your health care provider. Make sure you discuss any questions you have with your health care provider. Document Released: 04/28/2005 Document Revised: 01/01/2016 Document Reviewed: 03/12/2015 Elsevier Interactive Patient Education  2017 [employer].

## 2019-09-20 NOTE — Telephone Encounter (Signed)
error 

## 2019-09-21 NOTE — Telephone Encounter (Signed)
Patient said he followed up with Dr. Nani Ravens about his lower abdominal pain yesterday and they are ordering an ultrasound to check for hernia and has started patient on Miralax for constipation. Patient would just like you to know.

## 2019-09-28 ENCOUNTER — Other Ambulatory Visit: Payer: Self-pay

## 2019-09-28 ENCOUNTER — Ambulatory Visit (HOSPITAL_BASED_OUTPATIENT_CLINIC_OR_DEPARTMENT_OTHER)
Admission: RE | Admit: 2019-09-28 | Discharge: 2019-09-28 | Disposition: A | Discharge status: Home / Self Care | Payer: 59 | Source: Ambulatory Visit | Attending: Family Medicine | Admitting: Family Medicine

## 2019-09-28 DIAGNOSIS — R1031 Right lower quadrant pain: Secondary | ICD-10-CM | POA: Diagnosis not present

## 2020-03-13 ENCOUNTER — Other Ambulatory Visit: Payer: Self-pay

## 2020-03-13 ENCOUNTER — Encounter: Payer: Self-pay | Admitting: Family Medicine

## 2020-03-13 ENCOUNTER — Ambulatory Visit (INDEPENDENT_AMBULATORY_CARE_PROVIDER_SITE_OTHER): Payer: 59 | Admitting: Family Medicine

## 2020-03-13 VITALS — BP 110/78 | HR 67 | Temp 98.1°F | Ht 72.0 in | Wt 192.0 lb

## 2020-03-13 DIAGNOSIS — Z1159 Encounter for screening for other viral diseases: Secondary | ICD-10-CM | POA: Diagnosis not present

## 2020-03-13 DIAGNOSIS — Z0000 Encounter for general adult medical examination without abnormal findings: Secondary | ICD-10-CM
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
## 2020-03-13 NOTE — Patient Instructions (Addendum)
Give [place] 2-3 business days to get the results of your labs back.   Keep the diet clean and stay active.  The new Shingrix vaccine (for shingles) is a 2 shot series. It can make people feel low energy, achy and almost like they have the flu for 48 hours after injection. Please plan accordingly when deciding on when to get this shot. Call our office for a nurse visit appointment to get this. The second shot of the series is less severe regarding the side effects, but it still lasts 48 hours.   I do recommend you get the COVID-19 vaccination, either Pfizer or Moderna. Please don't hesitate to reach out if you have any questions.   Let us know if you need anything.

## 2020-03-14 LAB — COMPREHENSIVE METABOLIC PANEL WITH GFR
AG Ratio: 1.9 (calc) (ref 1.0–2.5)
ALT: 20 U/L (ref 9–46)
AST: 19 U/L (ref 10–35)
Albumin: 4.4 g/dL (ref 3.6–5.1)
Alkaline phosphatase (APISO): 47 U/L (ref 35–144)
BUN: 13 mg/dL (ref 7–25)
CO2: 27 mmol/L (ref 20–32)
Calcium: 9.7 mg/dL (ref 8.6–10.3)
Chloride: 103 mmol/L (ref 98–110)
Creat: 0.98 mg/dL (ref 0.70–1.33)
Globulin: 2.3 g/dL (ref 1.9–3.7)
Glucose, Bld: 88 mg/dL (ref 65–99)
Potassium: 4.7 mmol/L (ref 3.5–5.3)
Sodium: 141 mmol/L (ref 135–146)
Total Bilirubin: 0.8 mg/dL (ref 0.2–1.2)
Total Protein: 6.7 g/dL (ref 6.1–8.1)

## 2020-03-14 LAB — LIPID PANEL
Cholesterol: 163 mg/dL (ref <200)
HDL: 41 mg/dL (ref >=40)
LDL Cholesterol (Calc): 96 mg/dL (calc)
Non-HDL Cholesterol (Calc): 122 mg/dL (calc) (ref <130)
Total CHOL/HDL Ratio: 4 (calc) (ref <5.0)
Triglycerides: 159 mg/dL — ABNORMAL HIGH (ref <150)

## 2020-03-14 LAB — HEPATITIS C ANTIBODY
Hepatitis C Ab: NONREACTIVE
SIGNAL TO CUT-OFF: 0.02

## 2020-04-24 DIAGNOSIS — K219 Gastro-esophageal reflux disease without esophagitis: Secondary | ICD-10-CM

## 2020-04-24 DIAGNOSIS — R1013 Epigastric pain: Secondary | ICD-10-CM

## 2020-04-24 MED ORDER — DEXLANSOPRAZOLE 30 MG PO CPDR: 30.0000 mg | DELAYED_RELEASE_CAPSULE | Freq: Every day | ORAL | 0 refills | Status: DC

## 2020-08-14 ENCOUNTER — Other Ambulatory Visit: Payer: Self-pay

## 2020-08-14 DIAGNOSIS — K219 Gastro-esophageal reflux disease without esophagitis: Secondary | ICD-10-CM

## 2020-08-14 DIAGNOSIS — G8929 Other chronic pain: Secondary | ICD-10-CM

## 2020-08-15 MED ORDER — DEXLANSOPRAZOLE 30 MG PO CPDR: 30.0000 mg | DELAYED_RELEASE_CAPSULE | Freq: Every day | ORAL | 3 refills | Status: DC

## 2020-08-16 ENCOUNTER — Telehealth: Payer: Self-pay | Admitting: *Deleted

## 2020-08-16 NOTE — Telephone Encounter (Signed)
Appt scheduled for Monday 4/11

## 2020-08-16 NOTE — Telephone Encounter (Signed)
Who Is Calling Patient / Member / Family / Caregiver Caller Name Darrell Reid Caller Phone Number [phone] Call Type Message Only Information Provided Reason for Call Returning a Call from the Office Initial University states he wants an appointment and is returning call. Additional Comment Office hours provided. Caller needs a call back, missed call. Disp. Time Disposition Final User 08/16/2020 10:15:39 AM General Information Provided Yes [name]

## 2020-08-20 ENCOUNTER — Ambulatory Visit: Payer: 59 | Admitting: Family Medicine

## 2020-08-20 ENCOUNTER — Encounter: Payer: Self-pay | Admitting: Family Medicine

## 2020-08-20 ENCOUNTER — Other Ambulatory Visit: Payer: Self-pay

## 2020-08-20 VITALS — BP 130/80 | HR 100 | Temp 98.3°F | Ht 72.0 in | Wt 197.4 lb

## 2020-08-20 DIAGNOSIS — R0789 Other chest pain: Secondary | ICD-10-CM | POA: Diagnosis not present

## 2020-08-20 NOTE — Patient Instructions (Addendum)
Let me know if you change your mind about seeing a heart specialist.   Try to minimize your stress as much as possible.   Heat (pad or rice pillow in microwave) over affected area, 10-15 minutes twice daily.   Ice/cold pack over area for 10-15 min twice daily.  OK to take Tylenol 1000 mg (2 extra strength tabs) or 975 mg (3 regular strength tabs) every 6 hours as needed.  Let us know if you need anything.   Pectoralis Major Rehab Ask your health care provider which exercises are safe for you. Do exercises exactly as told by your health care provider and adjust them as directed. It is normal to feel mild stretching, pulling, tightness, or discomfort as you do these exercises, but you should stop right away if you feel sudden pain or your pain gets worse.Do not begin these exercises until told by your health care provider. Stretching and range of motion exercises These exercises warm up your muscles and joints and improve the movement and flexibility of your shoulder. These exercises can also help to relieve pain, numbness, and tingling. Exercise A: Pendulum  1. Stand near a wall or a surface that you can hold onto for balance. 2. Bend at the waist and let your left / right arm hang straight down. Use your other arm to keep your balance. 3. Relax your arm and shoulder muscles, and move your hips and your trunk so your left / right arm swings freely. Your arm should swing because of the motion of your body, not because you are using your arm or shoulder muscles. 4. Keep moving so your arm swings in the following directions, as told by your health care provider: ? Side to side. ? Forward and backward. ? In clockwise and counterclockwise circles. 5. Slowly return to the starting position. Repeat 2 times. Complete this exercise 3 times per week. Exercise B: Abduction, standing 1. Stand and hold a broomstick, a cane, or a similar object. Place your hands a little more than shoulder-width apart  on the object. Your left / right hand should be palm-up, and your other hand should be palm-down. 2. While keeping your elbow straight and your shoulder muscles relaxed, push the stick across your body toward your left / right side. Raise your left / right arm to the side of your body and then over your head until you feel a stretch in your shoulder. ? Stop when you reach the angle that is recommended by your health care provider. ? Avoid shrugging your shoulder while you raise your arm. Keep your shoulder blade tucked down toward the middle of your spine. 3. Hold for 10 seconds. 4. Slowly return to the starting position. Repeat 2 times. Complete this exercise 3 times per week. Exercise C: Wand flexion, supine  1. Lie on your back. You may bend your knees for comfort. 2. Hold a broomstick, a cane, or a similar object so that your hands are about shoulder-width apart on the object. Your palms should face toward your feet. 3. Raise your left / right arm in front of your face, then behind your head (toward the floor). Use your other hand to help you do this. Stop when you feel a gentle stretch in your shoulder, or when you reach the angle that is recommended by your health care provider. 4. Hold for 3 seconds. 5. Use the broomstick and your other arm to help you return your left / right arm to the starting position. Repeat  2 times. Complete this exercise 3 times per week. Exercise D: Wand shoulder external rotation 1. Stand and hold a broomstick, a cane, or a similar object so your handsare about shoulder-width apart on the object. 2. Start with your arms hanging down, then bend both elbows to an "L" shape (90 degrees). 3. Keep your left / right elbow at your side. Use your other hand to push the stick so your left / right forearm moves away from your body, out to your side. ? Keep your left / right elbow bent to 90 degrees and keep it against your side. ? Stop when you feel a gentle stretch in  your shoulder, or when you reach the angle recommended by your health care provider. 4. Hold for 10 seconds. 5. Use the stick to help you return your left / right arm to the starting position. Repeat 2 times. Complete this exercise 3 times per week. Strengthening exercises These exercises build strength and endurance in your shoulder. Endurance is the ability to use your muscles for a long time, even after your muscles get tired. Exercise E: Scapular protraction, standing 1. Stand so you are facing a wall. Place your feet about one arm-length away from the wall. 2. Place your hands on the wall and straighten your elbows. 3. Keep your hands on the wall as you push your upper back away from the wall. You should feel your shoulder blades sliding forward.Keep your elbows and your head still. ? If you are not sure that you are doing this exercise correctly, ask your health care provider for more instructions. 4. Hold for 3 seconds. 5. Slowly return to the starting position. Let your muscles relax completely before you repeat this exercise. Repeat 2 times. Complete this exercise 3 times per week. Exercise F: Shoulder blade squeezes  (scapular retraction) 1. Sit with good posture in a stable chair. Do not let your back touch the back of the chair. 2. Your arms should be at your sides with your elbows bent. You may rest your forearms on a pillow if that is more comfortable. 3. Squeeze your shoulder blades together. Bring them down and back. ? Keep your shoulders level. ? Do not lift your shoulders up toward your ears. 4. Hold for 3 seconds. 5. Return to the starting position. Repeat 2 times. Complete this exercise 3 times per week. This information is not intended to replace advice given to you by your health care provider. Make sure you discuss any questions you have with your health care provider. Document Released: 04/28/2005 Document Revised: 02/07/2016 Document Reviewed: 01/14/2015 Elsevier  Interactive Patient Education  [employer].

## 2020-08-20 NOTE — Progress Notes (Signed)
Chief Complaint  Patient presents with  . Hernia    Darrell Reid is a 55 y.o. male here for evaluation of L sided chest pain.  Duration of issue: 3 weeks Quality: achy Palliation: belching, Biofreeze Provocation: ate cheese pizza at midnight and bourbon; this is not exertional, stress Severity: 3/10 Radiation: none Duration of chest pain: a few seconds to several hours Associated symptoms: no SOB, jaw pain, arm pain; having some tingling in hands, though is positional  Cardiac history: none Family heart history: none Smoker? No  Past Medical History:  Diagnosis Date  . Fractured hand    left hand 2002  . GERD (gastroesophageal reflux disease)    Family History  Problem Relation Age of Onset  . Aortic aneurysm Mother   . Cancer Father        lung  . Esophageal cancer Neg Hx   . Colon cancer Neg Hx   . Stomach cancer Neg Hx   . Colitis Neg Hx   . Rectal cancer Neg Hx     BP 130/80 (BP Location: Right Arm, Patient Position: Sitting, Cuff Size: Normal)   Pulse 100   Temp 98.3 F (36.8 C) (Oral)   Ht 6' (1.829 m)   Wt 197 lb 6 oz (89.5 kg)   SpO2 98%   BMI 26.77 kg/m  Gen: awake, alert, appears stated age HEENT: PERRLA, MMM Neck: No masses or asymmetry Heart: RRR, no bruits, no LE edema Lungs: CTAB, no accessory muscle use Abd: Soft, NT, ND, no masses or organomegaly MSK: chest pain is reproducible to palpitation over L pec over ant axillary line at R 3-4 Psych: Age appropriate judgment and insight, nml mood and affect  Atypical chest pain  He really does not have any risk factors for cardiovascular disease other than age.  His chest pain is reproducible to palpation.  He has been having higher stress at work.  I do not think this is related to his heart or lungs.  I did offer to refer him to a cardiologist for peace of mind but he declined at this time.  He will let me know if he changes his mind.  Heat, ice, Tylenol, stretches and exercises for the pectoral.   He will let me know if anything changes. F/u prn. The patient voiced understanding and agreement to the plan.  [place], DO 08/20/20 3:16 PM

## 2020-09-07 ENCOUNTER — Ambulatory Visit (INDEPENDENT_AMBULATORY_CARE_PROVIDER_SITE_OTHER): Payer: 59

## 2020-09-07 ENCOUNTER — Other Ambulatory Visit: Payer: Self-pay

## 2020-09-07 DIAGNOSIS — R0789 Other chest pain: Secondary | ICD-10-CM

## 2020-09-07 NOTE — Progress Notes (Signed)
Patient here today for EKG per Dr. Nani Ravens for atypical chest pain.   EKG preformed and on file.   Per PCP, ekg normal-advised patient to follow plan as discussed with pcp at last visit.

## 2020-11-30 DIAGNOSIS — K219 Gastro-esophageal reflux disease without esophagitis: Secondary | ICD-10-CM

## 2020-11-30 DIAGNOSIS — G8929 Other chronic pain: Secondary | ICD-10-CM

## 2020-11-30 MED ORDER — DEXLANSOPRAZOLE 30 MG PO CPDR: 30.0000 mg | DELAYED_RELEASE_CAPSULE | Freq: Every day | ORAL | 3 refills | Status: DC

## 2020-12-14 ENCOUNTER — Other Ambulatory Visit: Payer: Self-pay | Admitting: Family Medicine

## 2020-12-14 MED ORDER — PANTOPRAZOLE SODIUM 40 MG PO TBEC: 40.0000 mg | DELAYED_RELEASE_TABLET | Freq: Every day | ORAL | 1 refills | Status: DC

## 2021-02-21 ENCOUNTER — Ambulatory Visit (HOSPITAL_BASED_OUTPATIENT_CLINIC_OR_DEPARTMENT_OTHER)
Admission: RE | Admit: 2021-02-21 | Discharge: 2021-02-21 | Disposition: A | Discharge status: Home / Self Care | Payer: 59 | Source: Ambulatory Visit | Attending: Medical | Admitting: Medical

## 2021-02-21 ENCOUNTER — Ambulatory Visit: Payer: 59 | Admitting: Medical

## 2021-02-21 ENCOUNTER — Other Ambulatory Visit: Payer: Self-pay

## 2021-02-21 VITALS — BP 140/80 | HR 74 | Temp 98.4°F | Resp 18 | Ht 72.0 in | Wt 197.6 lb

## 2021-02-21 DIAGNOSIS — R1032 Left lower quadrant pain: Secondary | ICD-10-CM

## 2021-02-21 DIAGNOSIS — R300 Dysuria: Secondary | ICD-10-CM | POA: Diagnosis not present

## 2021-02-21 DIAGNOSIS — R1010 Upper abdominal pain, unspecified: Secondary | ICD-10-CM | POA: Diagnosis not present

## 2021-02-21 DIAGNOSIS — K219 Gastro-esophageal reflux disease without esophagitis: Secondary | ICD-10-CM | POA: Diagnosis not present

## 2021-02-21 DIAGNOSIS — Z125 Encounter for screening for malignant neoplasm of prostate: Secondary | ICD-10-CM | POA: Diagnosis not present

## 2021-02-21 DIAGNOSIS — N50812 Left testicular pain: Secondary | ICD-10-CM

## 2021-02-21 LAB — CBC WITH DIFFERENTIAL/PLATELET
Basophils Absolute: 0 10*3/uL (ref 0.0–0.1)
Basophils Relative: 0.6 % (ref 0.0–3.0)
Eosinophils Absolute: 0.1 10*3/uL (ref 0.0–0.7)
Eosinophils Relative: 1.7 % (ref 0.0–5.0)
HCT: 45.3 % (ref 39.0–52.0)
Hemoglobin: 15.5 g/dL (ref 13.0–17.0)
Lymphocytes Relative: 26.8 % (ref 12.0–46.0)
Lymphs Abs: 1.3 10*3/uL (ref 0.7–4.0)
MCHC: 34.3 g/dL (ref 30.0–36.0)
MCV: 88.8 fl (ref 78.0–100.0)
Monocytes Absolute: 0.4 10*3/uL (ref 0.1–1.0)
Monocytes Relative: 7.4 % (ref 3.0–12.0)
Neutro Abs: 3 10*3/uL (ref 1.4–7.7)
Neutrophils Relative %: 63.5 % (ref 43.0–77.0)
Platelets: 191 10*3/uL (ref 150.0–400.0)
RBC: 5.1 Mil/uL (ref 4.22–5.81)
RDW: 13.6 % (ref 11.5–15.5)
WBC: 4.7 10*3/uL (ref 4.0–10.5)

## 2021-02-21 LAB — PSA: PSA: 0.81 ng/mL (ref 0.10–4.00)

## 2021-02-21 LAB — POC URINALSYSI DIPSTICK (AUTOMATED)
Bilirubin, UA: NEGATIVE
Blood, UA: NEGATIVE
Glucose, UA: NEGATIVE
Ketones, UA: NEGATIVE
Leukocytes, UA: NEGATIVE
Nitrite, UA: NEGATIVE
Protein, UA: NEGATIVE
Spec Grav, UA: 1.015 (ref 1.010–1.025)
Urobilinogen, UA: 0.2 E.U./dL
pH, UA: 6 (ref 5.0–8.0)

## 2021-02-21 LAB — COMPREHENSIVE METABOLIC PANEL
ALT: 17 U/L (ref 0–53)
AST: 19 U/L (ref 0–37)
Albumin: 4.6 g/dL (ref 3.5–5.2)
Alkaline Phosphatase: 47 U/L (ref 39–117)
BUN: 18 mg/dL (ref 6–23)
CO2: 32 mEq/L (ref 19–32)
Calcium: 9.6 mg/dL (ref 8.4–10.5)
Chloride: 103 mEq/L (ref 96–112)
Creatinine, Ser: 1.1 mg/dL (ref 0.40–1.50)
GFR: 76.86 mL/min (ref >60.00)
Glucose, Bld: 93 mg/dL (ref 70–99)
Potassium: 4.2 mEq/L (ref 3.5–5.1)
Sodium: 140 mEq/L (ref 135–145)
Total Bilirubin: 1.1 mg/dL (ref 0.2–1.2)
Total Protein: 6.8 g/dL (ref 6.0–8.3)
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
## 2021-02-21 NOTE — Patient Instructions (Signed)
Some intermittent left upper abdomen/quadrant region pain with history of some intermittent constipation.  Based on this history I decided to go ahead and get 1 view abdomen x-ray to assess possible stool burden.  If showing some degree of constipation will advise potential medication over-the-counter.  Possible magnesium containing products with magnesium citrate.  History of GERD.  Overall describing decent control of reflux symptoms.  Though better controlled historically with daily delixant.  Continue pantoprazole since taper.  If test not adequate in the future prior authorization might be able to be done?  Some left lower quadrant/left inguinal region pain recently.  On exam no hernia found but testicle is tender to palpation and tenderness in the canal itself.  Urinalysis was clear.  I do think is best to go ahead and get ultrasound of scrotum since he had intermittent testicle pain in the past.  After ultrasound study might give antibiotic.  For abdomen pain in left inguinal region pain decided to go ahead and get CBC, CMP and PSA.   Follow-up date to be determined after lab and imaging review.

## 2021-02-26 ENCOUNTER — Other Ambulatory Visit: Payer: Self-pay

## 2021-02-26 ENCOUNTER — Encounter (HOSPITAL_BASED_OUTPATIENT_CLINIC_OR_DEPARTMENT_OTHER): Payer: Self-pay

## 2021-02-26 ENCOUNTER — Other Ambulatory Visit (HOSPITAL_BASED_OUTPATIENT_CLINIC_OR_DEPARTMENT_OTHER): Payer: Self-pay | Admitting: Medical

## 2021-02-26 ENCOUNTER — Other Ambulatory Visit: Payer: Self-pay | Admitting: Medical

## 2021-02-26 ENCOUNTER — Ambulatory Visit (HOSPITAL_BASED_OUTPATIENT_CLINIC_OR_DEPARTMENT_OTHER)
Admission: RE | Admit: 2021-02-26 | Discharge: 2021-02-26 | Disposition: A | Discharge status: Home / Self Care | Payer: 59 | Source: Ambulatory Visit | Attending: Medical | Admitting: Medical

## 2021-02-26 ENCOUNTER — Ambulatory Visit (HOSPITAL_BASED_OUTPATIENT_CLINIC_OR_DEPARTMENT_OTHER): Payer: 59

## 2021-02-26 DIAGNOSIS — N50812 Left testicular pain: Secondary | ICD-10-CM

## 2021-03-15 ENCOUNTER — Other Ambulatory Visit: Payer: Self-pay

## 2021-03-15 ENCOUNTER — Ambulatory Visit (INDEPENDENT_AMBULATORY_CARE_PROVIDER_SITE_OTHER): Payer: 59 | Admitting: Family Medicine

## 2021-03-15 ENCOUNTER — Encounter: Payer: Self-pay | Admitting: Family Medicine

## 2021-03-15 VITALS — BP 134/80 | HR 71 | Temp 98.1°F | Ht 72.0 in | Wt 200.6 lb

## 2021-03-15 DIAGNOSIS — Z0000 Encounter for general adult medical examination without abnormal findings: Secondary | ICD-10-CM | POA: Diagnosis not present

## 2021-03-15 LAB — LIPID PANEL
Cholesterol: 156 mg/dL (ref 0–200)
HDL: 38.2 mg/dL — ABNORMAL LOW (ref >39.00)
LDL Cholesterol: 86 mg/dL (ref 0–99)
NonHDL: 117.55
Total CHOL/HDL Ratio: 4
Triglycerides: 156 mg/dL — ABNORMAL HIGH (ref 0.0–149.0)
VLDL: 31.2 mg/dL (ref 0.0–40.0)

## 2021-03-15 MED ORDER — PANTOPRAZOLE SODIUM 40 MG PO TBEC: 40.0000 mg | DELAYED_RELEASE_TABLET | Freq: Every day | ORAL | 1 refills | Status: DC
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
## 2021-03-15 NOTE — Patient Instructions (Addendum)
Give [place] 2-3 business days to get the results of your labs back.   Keep the diet clean and stay active.  The new Shingrix vaccine (for shingles) is a 2 shot series. It can make people feel low energy, achy and almost like they have the flu for 48 hours after injection. Please plan accordingly when deciding on when to get this shot. Call our office for a nurse visit appointment to get this. The second shot of the series is less severe regarding the side effects, but it still lasts 48 hours.   Let me know if you change your mind or have any questions about the flu shot or covid booster, please let me know.   Let us know if you need anything.

## 2021-04-03 ENCOUNTER — Telehealth: Payer: Self-pay

## 2021-04-03 NOTE — Telephone Encounter (Signed)
I have called and left a message for patient to call back and schedule an appointment with pre-visit for a recall colonoscopy.

## 2021-04-08 ENCOUNTER — Encounter: Payer: Self-pay | Admitting: Gastroenterology

## 2021-05-07 ENCOUNTER — Telehealth: Payer: Self-pay | Admitting: *Deleted

## 2021-05-07 NOTE — Telephone Encounter (Signed)
Pt. Was seen for chest pain 08/20/20 by pcp,he evaluated pt. And pt. Declined needing to see cardiologist would you  please review office visit and advise?

## 2021-05-13 NOTE — Telephone Encounter (Signed)
He already has appointment with me Will discuss more at [place]

## 2021-05-14 NOTE — Telephone Encounter (Signed)
Pt states that the appointment on 08/20/2020 was in regard to a hernia and it was not cardiac related whatsoever.  Pt was reminded of upcoming appointments that are scheduled for him. Pt verbalized understanding with all questions answered.

## 2021-05-22 ENCOUNTER — Other Ambulatory Visit: Payer: Self-pay

## 2021-05-22 ENCOUNTER — Ambulatory Visit (AMBULATORY_SURGERY_CENTER): Payer: 59 | Admitting: *Deleted

## 2021-05-22 VITALS — Ht 72.0 in | Wt 200.0 lb

## 2021-05-22 DIAGNOSIS — Z86010 Personal history of colonic polyps: Secondary | ICD-10-CM

## 2021-05-22 MED ORDER — NA SULFATE-K SULFATE-MG SULF 17.5-3.13-1.6 GM/177ML PO SOLN
1.0000 | Freq: Once | ORAL | 0 refills | Status: AC
Start: 2021-05-22 — End: 2021-05-22

## 2021-05-22 NOTE — Progress Notes (Signed)
No egg or soy allergy known to patient  No issues known to pt with past sedation with any surgeries or procedures Patient denies ever being told they had issues or difficulty with intubation  No FH of Malignant Hyperthermia Pt is not on diet pills Pt is not on  home 02  Pt is not on blood thinners  Pt denies issues with constipation  No A fib or A flutter  Pt is fully vaccinated  for Covid   NO PA's for preps discussed with pt In PV today  Discussed with pt there will be an out-of-pocket cost for prep and that varies from $0 to 70 +  dollars - pt verbalized understanding   Due to the COVID-19 pandemic we are asking patients to follow certain guidelines in PV and the Impact   Pt aware of COVID protocols and [facility] guidelines   PV completed over the phone. Pt verified name, DOB, address and insurance during PV today.  .  Pt encouraged to call with questions or issues.  If pt has My chart, procedure instructions sent via My Chart

## 2021-05-27 ENCOUNTER — Encounter: Payer: Self-pay | Admitting: Gastroenterology

## 2021-06-03 ENCOUNTER — Encounter: Payer: Self-pay | Admitting: Gastroenterology

## 2021-06-05 ENCOUNTER — Encounter: Payer: Self-pay | Admitting: Gastroenterology

## 2021-06-05 ENCOUNTER — Ambulatory Visit (AMBULATORY_SURGERY_CENTER): Payer: 59 | Admitting: Gastroenterology

## 2021-06-05 ENCOUNTER — Other Ambulatory Visit: Payer: Self-pay

## 2021-06-05 VITALS — BP 133/90 | HR 69 | Temp 98.6°F | Resp 15 | Ht 72.0 in | Wt 200.0 lb

## 2021-06-05 DIAGNOSIS — K621 Rectal polyp: Secondary | ICD-10-CM | POA: Diagnosis not present

## 2021-06-05 DIAGNOSIS — Z86010 Personal history of colonic polyps: Secondary | ICD-10-CM | POA: Diagnosis present

## 2021-06-05 DIAGNOSIS — D120 Benign neoplasm of cecum: Secondary | ICD-10-CM

## 2021-06-05 DIAGNOSIS — K635 Polyp of colon: Secondary | ICD-10-CM

## 2021-06-05 DIAGNOSIS — D128 Benign neoplasm of rectum: Secondary | ICD-10-CM

## 2021-06-05 MED ORDER — SODIUM CHLORIDE 0.9 % IV SOLN: 500.0000 mL | Freq: Once | INTRAVENOUS | Status: DC

## 2021-06-05 NOTE — Progress Notes (Signed)
PT taken to PACU. Monitors in place. VSS. Report given to RN. 
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
## 2021-06-05 NOTE — Op Note (Signed)
[facility] Patient Name: Darrell Reid Procedure Date: 06/05/2021 8:46 AM MRN: [id number] Endoscopist: Jackquline Denmark , MD Age: 56 Referring MD:  Date of Birth: 1966-05-02 Gender: Male Account #: [id number] Procedure:                Colonoscopy Indications:              High risk colon cancer surveillance: Personal                            history of colonic polyps Medicines:                Monitored Anesthesia Care Procedure:                Pre-Anesthesia Assessment:                           - Prior to the procedure, a History and Physical                            was performed, and patient medications and                            allergies were reviewed. The patient's tolerance of                            previous anesthesia was also reviewed. The risks                            and benefits of the procedure and the sedation                            options and risks were discussed with the patient.                            All questions were answered, and informed consent                            was obtained. Prior Anticoagulants: The patient has                            taken no previous anticoagulant or antiplatelet                            agents. ASA Grade Assessment: II - A patient with                            mild systemic disease. After reviewing the risks                            and benefits, the patient was deemed in                            satisfactory condition to undergo the procedure.  After obtaining informed consent, the colonoscope                            was passed under direct vision. Throughout the                            procedure, the patient's blood pressure, pulse, and                            oxygen saturations were monitored continuously. The                            Olympus CF-HQ190L [id number]) Colonoscope was                            introduced through the anus and advanced to  the 2                            cm into the ileum. The colonoscopy was performed                            without difficulty. The patient tolerated the                            procedure well. The quality of the bowel                            preparation was good. The terminal ileum, ileocecal                            valve, appendiceal orifice, and rectum were                            photographed. Scope In: 9:03:30 AM Scope Out: 9:17:56 AM Scope Withdrawal Time: 0 hours 11 minutes 31 seconds  Total Procedure Duration: 0 hours 14 minutes 26 seconds  Findings:                 Two sessile polyps were found in the rectum and                            cecum. The polyps were 2 to 4 mm in size. These                            polyps were removed with a cold snare. Resection                            and retrieval were complete.                           A few medium-mouthed diverticula were found in the                            sigmoid colon and descending colon.  Non-bleeding internal hemorrhoids were found during                            retroflexion. The hemorrhoids were small and Grade                            I (internal hemorrhoids that do not prolapse).                           The terminal ileum appeared normal.                           The exam was otherwise without abnormality on                            direct and retroflexion views. Complications:            No immediate complications. Estimated Blood Loss:     Estimated blood loss: none. Impression:               - Two 2 to 4 mm polyps in the rectum and in the                            cecum, removed with a cold snare. Resected and                            retrieved.                           - Mild left colonic diverticulosis.                           - Non-bleeding internal hemorrhoids.                           - The examined portion of the ileum was normal.                            - The examination was otherwise normal on direct                            and retroflexion views. Recommendation:           - Patient has a contact number available for                            emergencies. The signs and symptoms of potential                            delayed complications were discussed with the                            patient. Return to normal activities tomorrow.                            Written discharge instructions were provided to the  patient.                           - Resume previous diet.                           - Continue present medications.                           - Await pathology results.                           - Repeat colonoscopy in 7 years (likely, depending                            upon the biopsy results) for surveillance.                           - Return to GI clinic PRN.                           - The findings and recommendations were discussed                            with the patient's family. Jackquline Denmark, MD 06/05/2021 9:23:23 AM This report has been signed electronically.

## 2021-06-05 NOTE — Patient Instructions (Signed)
Resume previous diet and medications. Awaiting pathology results. Repeat Colonoscopy in 7 years for surveillance. Return to GI clinic as needed.  YOU HAD AN ENDOSCOPIC PROCEDURE TODAY AT [facility]:   Refer to the procedure report that was given to you for any specific questions about what was found during the examination.  If the procedure report does not answer your questions, please call your gastroenterologist to clarify.  If you requested that your care partner not be given the details of your procedure findings, then the procedure report has been included in a sealed envelope for you to review at your convenience later.  YOU SHOULD EXPECT: Some feelings of bloating in the abdomen. Passage of more gas than usual.  Walking can help get rid of the air that was put into your GI tract during the procedure and reduce the bloating. If you had a lower endoscopy (such as a colonoscopy or flexible sigmoidoscopy) you may notice spotting of blood in your stool or on the toilet paper. If you underwent a bowel prep for your procedure, you may not have a normal bowel movement for a few days.  Please Note:  You might notice some irritation and congestion in your nose or some drainage.  This is from the oxygen used during your procedure.  There is no need for concern and it should clear up in a day or so.  SYMPTOMS TO REPORT IMMEDIATELY:  Following lower endoscopy (colonoscopy or flexible sigmoidoscopy):  Excessive amounts of blood in the stool  Significant tenderness or worsening of abdominal pains  Swelling of the abdomen that is new, acute  Fever of 100F or higher  For urgent or emergent issues, a gastroenterologist can be reached at any hour by calling [phone]. Do not use MyChart messaging for urgent concerns.    DIET:  We do recommend a small meal at first, but then you may proceed to your regular diet.  Drink plenty of fluids but you should avoid alcoholic beverages for 24  hours.  ACTIVITY:  You should plan to take it easy for the rest of today and you should NOT DRIVE or use heavy machinery until tomorrow (because of the sedation medicines used during the test).    FOLLOW UP: Our staff will call the number listed on your records 48-72 hours following your procedure to check on you and address any questions or concerns that you may have regarding the information given to you following your procedure. If we do not reach you, we will leave a message.  We will attempt to reach you two times.  During this call, we will ask if you have developed any symptoms of COVID 19. If you develop any symptoms (ie: fever, flu-like symptoms, shortness of breath, cough etc.) before then, please call [phone].  If you test positive for Covid 19 in the 2 weeks post procedure, please call and report this information to [place].    If any biopsies were taken you will be contacted by phone or by letter within the next 1-3 weeks.  Please call us at [phone] if you have not heard about the biopsies in 3 weeks.    SIGNATURES/CONFIDENTIALITY: You and/or your care partner have signed paperwork which will be entered into your electronic medical record.  These signatures attest to the fact that that the information above on your After Visit Summary has been reviewed and is understood.  Full responsibility of the confidentiality of this discharge information lies with you and/or  your care-partner.

## 2021-06-05 NOTE — Progress Notes (Signed)
Pt's states no medical or surgical changes since previsit or office visit. 

## 2021-06-05 NOTE — Progress Notes (Signed)
Called to room to assist during endoscopic procedure.  Patient ID and intended procedure confirmed with present staff. Received instructions for my participation in the procedure from the performing physician.  

## 2021-06-07 ENCOUNTER — Telehealth: Payer: Self-pay | Admitting: *Deleted

## 2021-06-07 NOTE — Telephone Encounter (Signed)
°  Follow up Call-  Call back number 06/05/2021  Post procedure Call Back phone  # [phone]-  Permission to leave phone message Yes  Some recent data might be hidden     Patient questions:  Do you have a fever, pain , or abdominal swelling? No. Pain Score  0 *  Have you tolerated food without any problems? Yes.    Have you been able to return to your normal activities? Yes.    Do you have any questions about your discharge instructions: Diet   No. Medications  No. Follow up visit  No.  Do you have questions or concerns about your Care? No.  Actions: * If pain score is 4 or above: No action needed, pain <4.

## 2021-06-11 ENCOUNTER — Encounter: Payer: Self-pay | Admitting: Family Medicine

## 2021-06-11 ENCOUNTER — Other Ambulatory Visit: Payer: Self-pay | Admitting: Family Medicine

## 2021-06-11 MED ORDER — PANTOPRAZOLE SODIUM 40 MG PO TBEC: 40.0000 mg | DELAYED_RELEASE_TABLET | Freq: Every day | ORAL | 1 refills | Status: DC

## 2021-06-16 ENCOUNTER — Encounter: Payer: Self-pay | Admitting: Gastroenterology

## 2021-08-27 ENCOUNTER — Encounter: Payer: Self-pay | Admitting: Family Medicine

## 2021-09-19 ENCOUNTER — Other Ambulatory Visit: Payer: Self-pay | Admitting: Family Medicine

## 2021-09-19 DIAGNOSIS — K219 Gastro-esophageal reflux disease without esophagitis: Secondary | ICD-10-CM

## 2021-09-19 DIAGNOSIS — G8929 Other chronic pain: Secondary | ICD-10-CM

## 2021-09-23 ENCOUNTER — Other Ambulatory Visit: Payer: Self-pay | Admitting: Family Medicine

## 2021-09-23 ENCOUNTER — Encounter: Payer: Self-pay | Admitting: Family Medicine

## 2021-09-23 MED ORDER — PANTOPRAZOLE SODIUM 40 MG PO TBEC: DELAYED_RELEASE_TABLET | ORAL | 2 refills | Status: DC

## 2021-12-08 IMAGING — US US SCROTUM W/ DOPPLER COMPLETE
1 series · 14 of 25 positions shown · non-contrast
Comparison: None.

CLINICAL DATA: Left testicular pain for 5 years

EXAM:
SCROTAL ULTRASOUND
DOPPLER ULTRASOUND OF THE TESTICLES
TECHNIQUE: Complete ultrasound examination of the testicles, epididymis, and
other scrotal structures was performed. Color and spectral Doppler
ultrasound were also utilized to evaluate blood flow to the
testicles.

[Series 1: us scrotum w/ doppler complete · 14 of 102 slices shown]
[im 1/102]
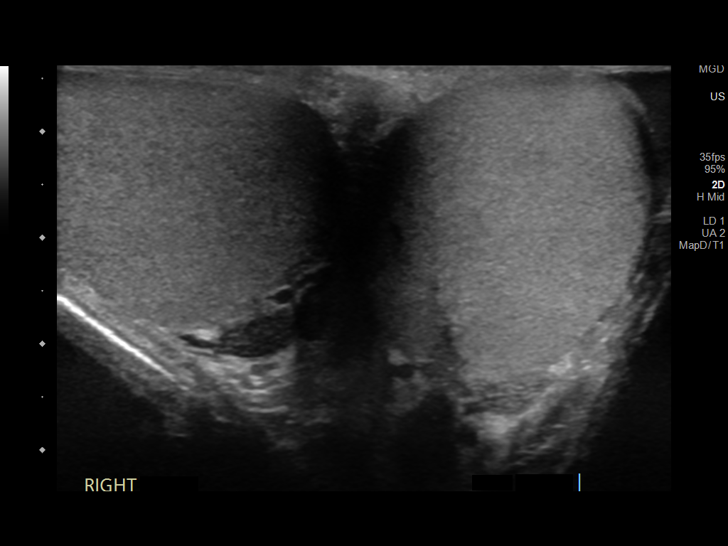
[im 9/102]
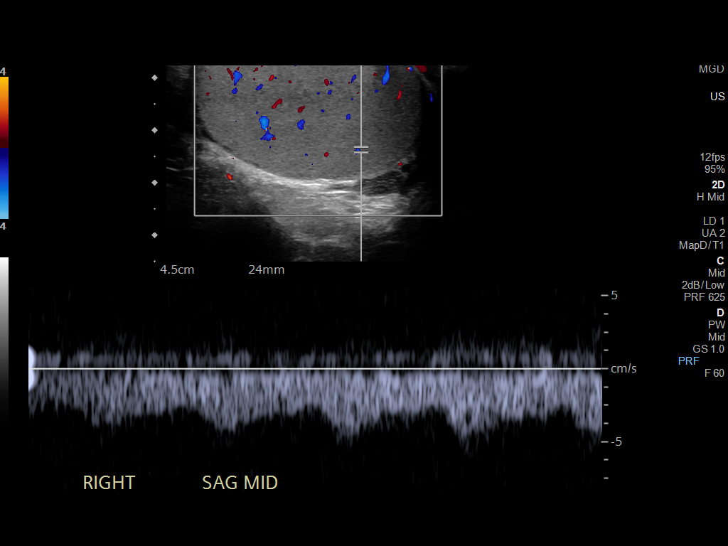
[im 17/102]
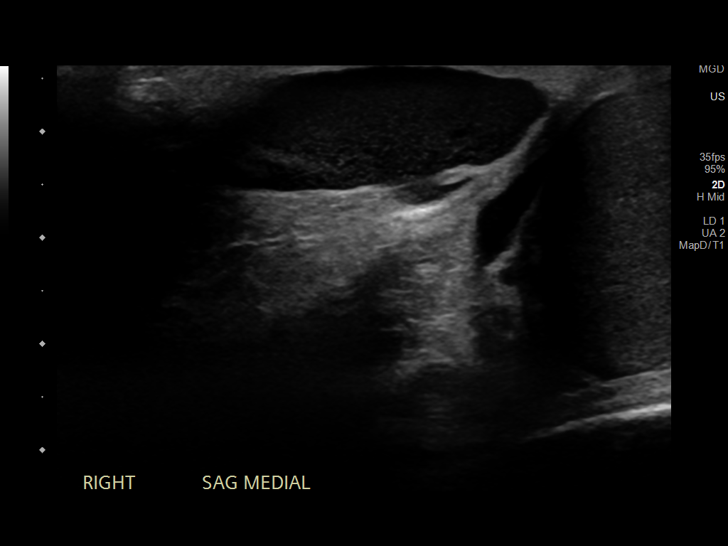
[im 26/102]
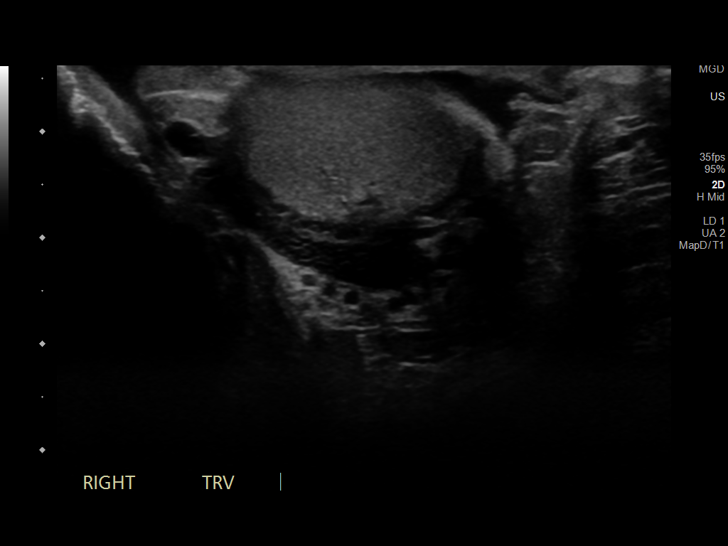
[im 34/102]
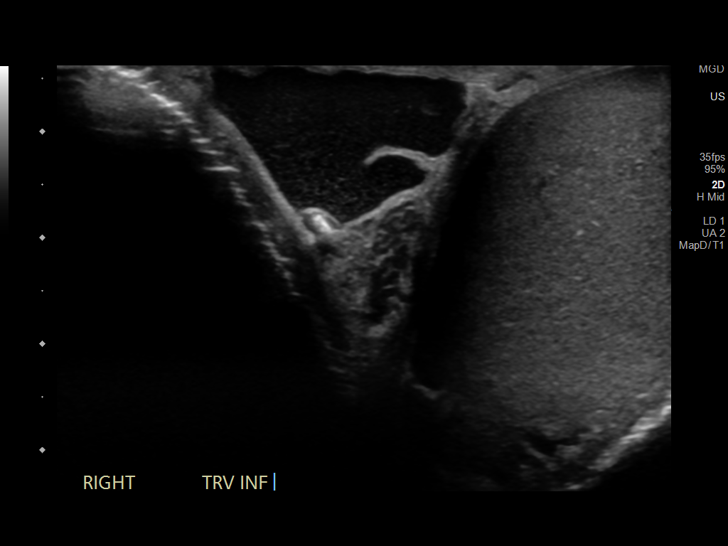
[im 38/102]
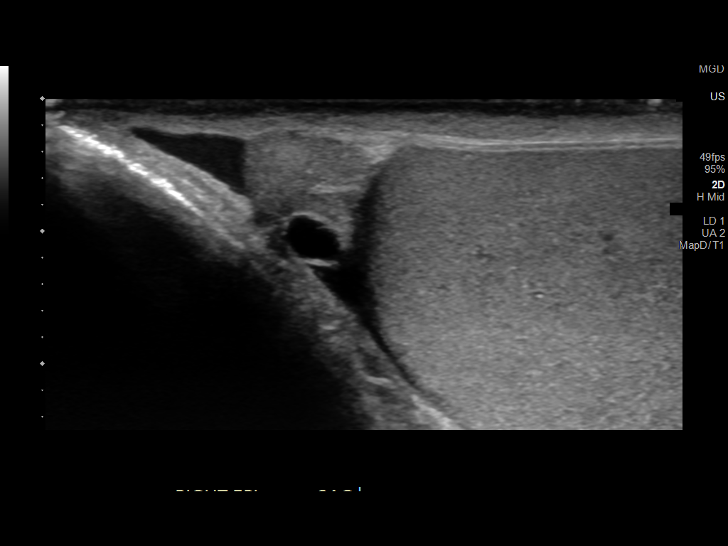
[im 47/102]
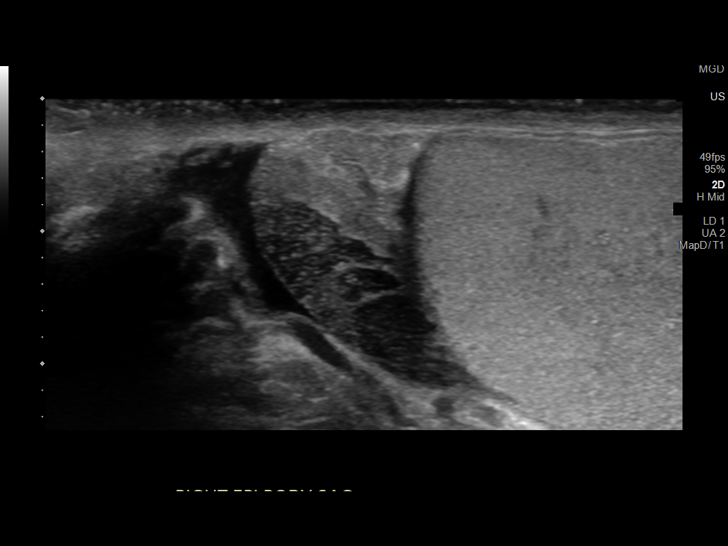
[im 55/102]
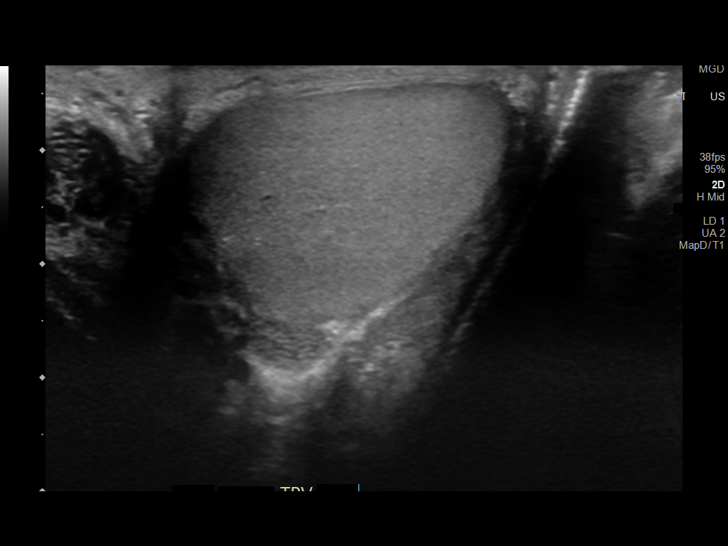
[im 64/102]
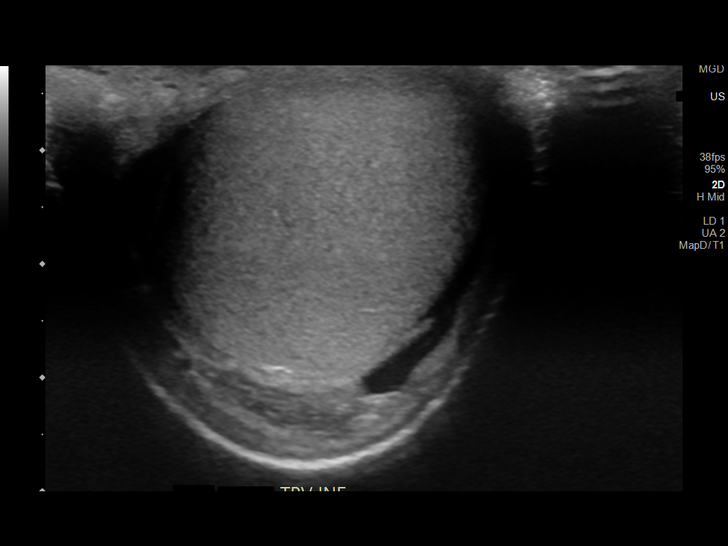
[im 68/102]
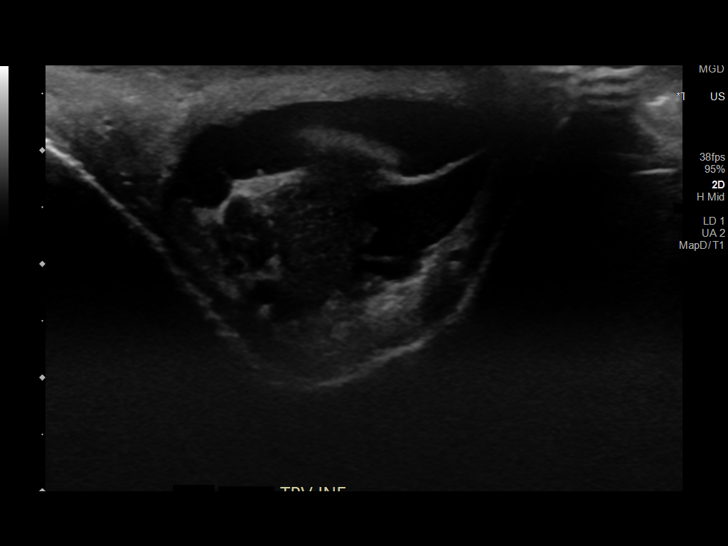
[im 76/102]
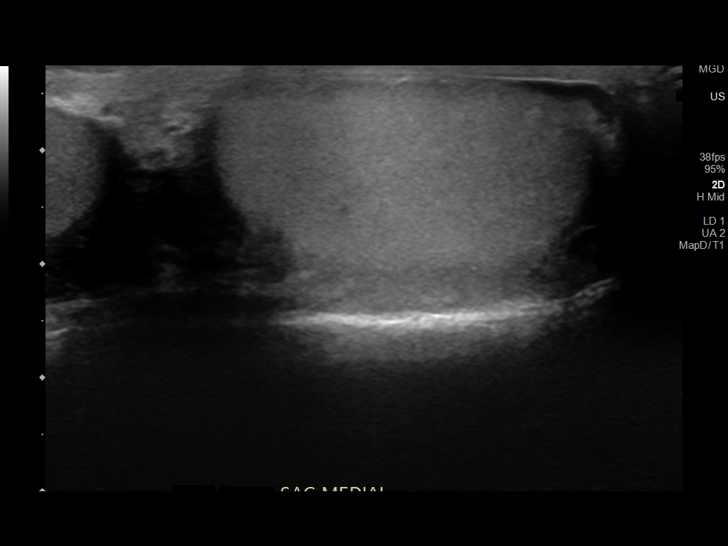
[im 85/102]
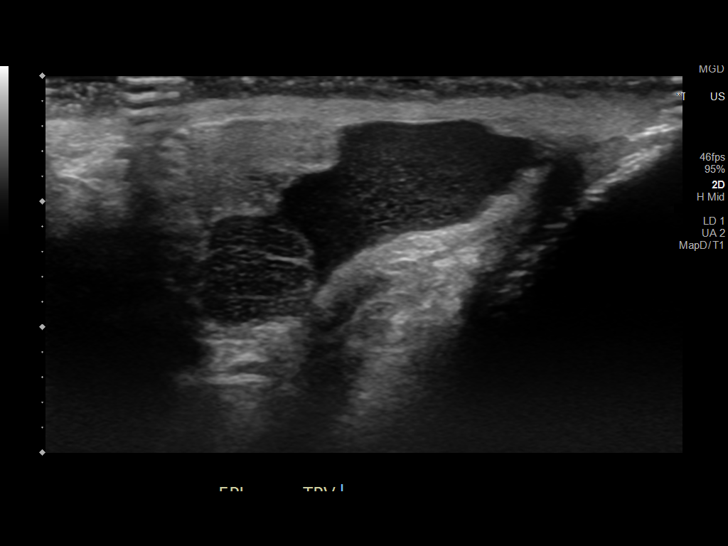
[im 93/102]
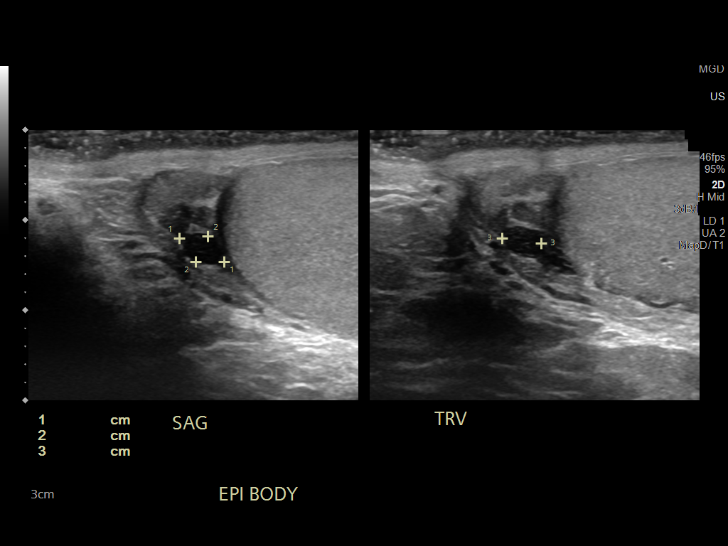
[im 102/102]
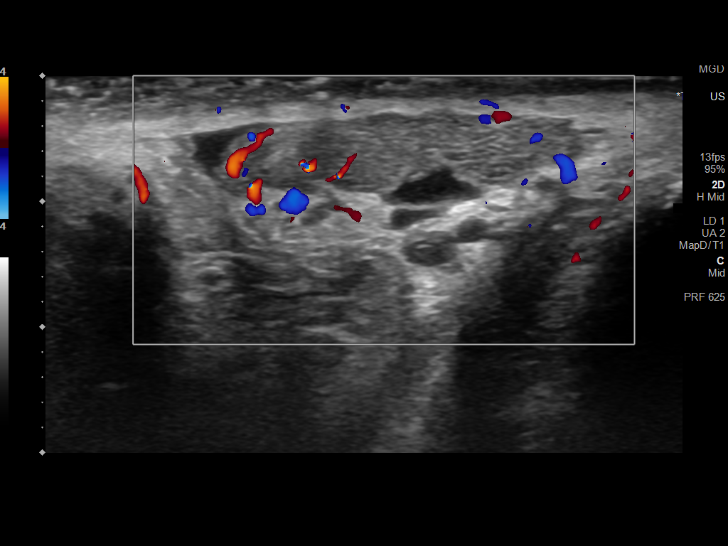

[14 of 25 positions shown; findings below may reference images not displayed]

FINDINGS: Right testicle

Measurements: 4.3 x 2.5 x 3.3 cm. No mass or microlithiasis
visualized.

Left testicle

Measurements: 3.9 x 2.4 x 3.2 cm. No mass or microlithiasis
visualized.

Right epididymis: Small epididymal head cyst is present. Mild
diffuse heterogeneity.

Left epididymis: Small epididymal head cyst is present. Mild diffuse
heterogeneity.

Hydrocele:  Small bilateral hydroceles.

Varicocele:  None visualized.

Pulsed Doppler interrogation of both testes demonstrates normal low
resistance arterial and venous waveforms bilaterally.
IMPRESSION: Small bilateral hydroceles. Mild nonspecific heterogeneity of the
epididymi bilaterally.

## 2022-03-17 ENCOUNTER — Encounter: Payer: Self-pay | Admitting: Family Medicine

## 2022-03-17 ENCOUNTER — Ambulatory Visit (INDEPENDENT_AMBULATORY_CARE_PROVIDER_SITE_OTHER): Payer: 59 | Admitting: Family Medicine

## 2022-03-17 VITALS — BP 130/80 | HR 80 | Temp 98.5°F | Ht 72.0 in | Wt 213.0 lb

## 2022-03-17 DIAGNOSIS — Z125 Encounter for screening for malignant neoplasm of prostate: Secondary | ICD-10-CM | POA: Diagnosis not present

## 2022-03-17 DIAGNOSIS — Z0000 Encounter for general adult medical examination without abnormal findings: Secondary | ICD-10-CM | POA: Diagnosis not present

## 2022-03-17 LAB — LIPID PANEL
Cholesterol: 167 mg/dL (ref 0–200)
HDL: 41.5 mg/dL (ref >39.00)
LDL Cholesterol: 93 mg/dL (ref 0–99)
NonHDL: 125.85
Total CHOL/HDL Ratio: 4
Triglycerides: 162 mg/dL — ABNORMAL HIGH (ref 0.0–149.0)
VLDL: 32.4 mg/dL (ref 0.0–40.0)

## 2022-03-17 LAB — COMPREHENSIVE METABOLIC PANEL
ALT: 25 U/L (ref 0–53)
AST: 18 U/L (ref 0–37)
Albumin: 4.3 g/dL (ref 3.5–5.2)
Alkaline Phosphatase: 54 U/L (ref 39–117)
BUN: 15 mg/dL (ref 6–23)
CO2: 27 mEq/L (ref 19–32)
Calcium: 9.2 mg/dL (ref 8.4–10.5)
Chloride: 105 mEq/L (ref 96–112)
Creatinine, Ser: 1 mg/dL (ref 0.40–1.50)
GFR: 85.53 mL/min (ref >60.00)
Glucose, Bld: 94 mg/dL (ref 70–99)
Potassium: 4.2 mEq/L (ref 3.5–5.1)
Sodium: 140 mEq/L (ref 135–145)
Total Bilirubin: 0.8 mg/dL (ref 0.2–1.2)
Total Protein: 6.5 g/dL (ref 6.0–8.3)

## 2022-03-17 LAB — CBC
HCT: 45.7 % (ref 39.0–52.0)
Hemoglobin: 15.5 g/dL (ref 13.0–17.0)
MCHC: 33.9 g/dL (ref 30.0–36.0)
MCV: 88.9 fl (ref 78.0–100.0)
Platelets: 177 10*3/uL (ref 150.0–400.0)
RBC: 5.14 Mil/uL (ref 4.22–5.81)
RDW: 13.9 % (ref 11.5–15.5)
WBC: 5.2 10*3/uL (ref 4.0–10.5)

## 2022-03-17 LAB — PSA: PSA: 0.83 ng/mL (ref 0.10–4.00)

## 2022-03-17 MED ORDER — PANTOPRAZOLE SODIUM 40 MG PO TBEC: DELAYED_RELEASE_TABLET | ORAL | 3 refills | Status: DC

## 2022-03-17 NOTE — Patient Instructions (Addendum)
Give us 2-3 business days to get the results of your labs back.   Keep the diet clean and stay active.  Please get me a copy of your advanced directive form at your convenience.   The Shingrix vaccine (for shingles) is a 2 shot series spaced 2-6 months apart. It can make people feel low energy, achy and almost like they have the flu for 48 hours after injection. 1/5 people can have nausea and/or vomiting. Please plan accordingly when deciding on when to get this shot. Call our office for a nurse visit appointment to get this. The second shot of the series is less severe regarding the side effects, but it still lasts 48 hours.   Let us know if you need anything.  
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
## 2022-06-30 ENCOUNTER — Encounter: Payer: Self-pay | Admitting: Family Medicine

## 2022-07-08 ENCOUNTER — Ambulatory Visit: Payer: 59 | Admitting: Family Medicine

## 2022-07-08 ENCOUNTER — Encounter: Payer: Self-pay | Admitting: Family Medicine

## 2022-07-08 VITALS — BP 132/84 | HR 70 | Temp 98.3°F | Ht 72.0 in | Wt 214.1 lb

## 2022-07-08 DIAGNOSIS — J069 Acute upper respiratory infection, unspecified: Secondary | ICD-10-CM | POA: Diagnosis not present

## 2022-07-08 MED ORDER — PREDNISONE 20 MG PO TABS: 20.0000 mg | ORAL_TABLET | Freq: Every day | ORAL | 0 refills | Status: AC

## 2022-07-08 NOTE — Patient Instructions (Signed)
Continue to push fluids, practice good hand hygiene, and cover your mouth if you cough. ? ?If you start having fevers, shaking or shortness of breath, seek immediate care. ? ?OK to take Tylenol 1000 mg (2 extra strength tabs) or 975 mg (3 regular strength tabs) every 6 hours as needed. ? ?Let us know if you need anything. ?

## 2022-07-08 NOTE — Progress Notes (Signed)
Chief Complaint  Patient presents with   Nasal Congestion    Darrell Reid here for URI complaints.  Duration: 2 weeks  Associated symptoms: chest tightness, wheezing, and slight cough Denies: fsinus congestion, sinus pain, rhinorrhea, itchy watery eyes, ear pain, ear drainage, sore throat, shortness of breath, myalgia, an fevers Treatment to date: Sudafed, Mucinex Sick contacts: Yes; a friend he went on a golf trip with had a cold  Past Medical History:  Diagnosis Date   Fractured hand    left hand 2002   GERD (gastroesophageal reflux disease)     Objective BP 132/84 (BP Location: Left Arm, Patient Position: Sitting, Cuff Size: Normal)   Pulse 70   Temp 98.3 F (36.8 C) (Oral)   Ht 6' (1.829 m)   Wt 214 lb 2 oz (97.1 kg)   SpO2 98%   BMI 29.04 kg/m  General: Awake, alert, appears stated age HEENT: AT, , ears patent b/l and TM's neg, nares patent w/o discharge, pharynx pink and without exudates, MMM, mild ttp over L max sinus Neck: No masses or asymmetry Heart: RRR Lungs: CTAB, no accessory muscle use Psych: Age appropriate judgment and insight, normal mood and affect  Viral URI  5 d pred burst 40 mg/d. Send message in 2-3 days if no better. Continue to push fluids, practice good hand hygiene, cover mouth when coughing. F/u prn. If starting to experience fevers, shaking, or shortness of breath, seek immediate care. Pt voiced understanding and agreement to the plan.  [place], DO 07/08/22 2:40 PM

## 2022-08-25 ENCOUNTER — Encounter: Payer: Self-pay | Admitting: Family Medicine

## 2022-08-25 ENCOUNTER — Ambulatory Visit: Payer: 59 | Admitting: Family Medicine

## 2022-08-25 VITALS — BP 130/80 | HR 70 | Temp 97.9°F | Ht 72.0 in | Wt 214.2 lb

## 2022-08-25 DIAGNOSIS — H6591 Unspecified nonsuppurative otitis media, right ear: Secondary | ICD-10-CM

## 2022-08-25 MED ORDER — PREDNISONE 20 MG PO TABS: 40.0000 mg | ORAL_TABLET | Freq: Every day | ORAL | 0 refills | Status: AC

## 2022-08-25 MED ORDER — FLUTICASONE PROPIONATE 50 MCG/ACT NA SUSP: 2.0000 | Freq: Every day | NASAL | 6 refills | Status: DC

## 2022-08-25 NOTE — Progress Notes (Signed)
Chief Complaint  Patient presents with   Ear Pain    Pt is here for right ear fullness. Duration: 4 days Progression: unchanged; ear didn't pop Associated symptoms: fullness, decreased hearing Denies: sore throat, congestion, pain, coryza, bleeding, or discharge from ear Treatment to date: Peroxide, rubbing alcohol  Past Medical History:  Diagnosis Date   Fractured hand    left hand 2002   GERD (gastroesophageal reflux disease)     BP 130/80 (BP Location: Left Arm, Patient Position: Sitting, Cuff Size: Normal)   Pulse 70   Temp 97.9 F (36.6 C) (Oral)   Ht 6' (1.829 m)   Wt 214 lb 4 oz (97.2 kg)   SpO2 97%   BMI 29.06 kg/m  General: Awake, alert, appearing stated age HEENT:  L ear- Canal patent without drainage or erythema, TM is neg R ear- canal patent without drainage or erythema, TM is slightly bulging with yellowish/serous fluid posteriorly, no erythema or purulence. Lungs: Normal effort, no accessory muscle use Psych: Age appropriate judgment and insight, normal mood and affect  Fluid level behind tympanic membrane of right ear - Plan: predniSONE (DELTASONE) 20 MG tablet  5-day prednisone burst 40 mg/day, should consider an intranasal corticosteroid during allergy seasons to keep open the eustachian tube.  Send message if no improvement in the next couple weeks. F/u prn.  Pt voiced understanding and agreement to the plan.  Jilda Roche [place], DO 08/25/22 4:22 PM

## 2022-08-25 NOTE — Patient Instructions (Signed)
This may persist a few days after you finish the steroids.  Consider a nasal spray during the spring in the future.  Flonase (fluticasone); nasal spray that is over the counter. 2 sprays each nostril, once daily. Aim towards the same side eye when you spray.  Let us know if you need anything.

## 2022-09-01 ENCOUNTER — Encounter: Payer: Self-pay | Admitting: Family Medicine

## 2022-09-01 ENCOUNTER — Other Ambulatory Visit: Payer: Self-pay | Admitting: Family Medicine

## 2022-09-01 DIAGNOSIS — H6591 Unspecified nonsuppurative otitis media, right ear: Secondary | ICD-10-CM

## 2022-09-03 ENCOUNTER — Other Ambulatory Visit: Payer: Self-pay | Admitting: Family Medicine

## 2022-09-03 DIAGNOSIS — H6591 Unspecified nonsuppurative otitis media, right ear: Secondary | ICD-10-CM

## 2022-09-08 ENCOUNTER — Encounter: Payer: Self-pay | Admitting: Family Medicine

## 2022-09-08 ENCOUNTER — Other Ambulatory Visit: Payer: Self-pay | Admitting: Family Medicine

## 2022-09-08 MED ORDER — AMOXICILLIN 875 MG PO TABS: 875.0000 mg | ORAL_TABLET | Freq: Two times a day (BID) | ORAL | 0 refills | Status: AC

## 2023-03-13 ENCOUNTER — Other Ambulatory Visit: Payer: Self-pay | Admitting: Family Medicine

## 2023-03-23 ENCOUNTER — Ambulatory Visit (INDEPENDENT_AMBULATORY_CARE_PROVIDER_SITE_OTHER): Payer: 59 | Admitting: Family Medicine

## 2023-03-23 ENCOUNTER — Encounter: Payer: Self-pay | Admitting: Family Medicine

## 2023-03-23 ENCOUNTER — Other Ambulatory Visit: Payer: Self-pay

## 2023-03-23 ENCOUNTER — Telehealth: Payer: Self-pay | Admitting: Family Medicine

## 2023-03-23 VITALS — BP 128/78 | HR 83 | Temp 98.0°F | Resp 16 | Ht 73.0 in | Wt 216.6 lb

## 2023-03-23 DIAGNOSIS — E782 Mixed hyperlipidemia: Secondary | ICD-10-CM

## 2023-03-23 DIAGNOSIS — Z0000 Encounter for general adult medical examination without abnormal findings: Secondary | ICD-10-CM

## 2023-03-23 DIAGNOSIS — Z125 Encounter for screening for malignant neoplasm of prostate: Secondary | ICD-10-CM | POA: Diagnosis not present

## 2023-03-23 LAB — COMPREHENSIVE METABOLIC PANEL
ALT: 41 U/L (ref 0–53)
AST: 25 U/L (ref 0–37)
Albumin: 4.5 g/dL (ref 3.5–5.2)
Alkaline Phosphatase: 60 U/L (ref 39–117)
BUN: 15 mg/dL (ref 6–23)
CO2: 29 meq/L (ref 19–32)
Calcium: 9.6 mg/dL (ref 8.4–10.5)
Chloride: 103 meq/L (ref 96–112)
Creatinine, Ser: 1.03 mg/dL (ref 0.40–1.50)
GFR: 81.96 mL/min (ref >60.00)
Glucose, Bld: 90 mg/dL (ref 70–99)
Potassium: 4.2 meq/L (ref 3.5–5.1)
Sodium: 140 meq/L (ref 135–145)
Total Bilirubin: 1.1 mg/dL (ref 0.2–1.2)
Total Protein: 7.1 g/dL (ref 6.0–8.3)

## 2023-03-23 LAB — PSA: PSA: 0.95 ng/mL (ref 0.10–4.00)

## 2023-03-23 LAB — LIPID PANEL
Cholesterol: 163 mg/dL (ref 0–200)
HDL: 35.1 mg/dL — ABNORMAL LOW (ref >39.00)
LDL Cholesterol: 84 mg/dL (ref 0–99)
NonHDL: 127.8
Total CHOL/HDL Ratio: 5
Triglycerides: 221 mg/dL — ABNORMAL HIGH (ref 0.0–149.0)
VLDL: 44.2 mg/dL — ABNORMAL HIGH (ref 0.0–40.0)

## 2023-03-23 LAB — CBC
HCT: 48.3 % (ref 39.0–52.0)
Hemoglobin: 16.5 g/dL (ref 13.0–17.0)
MCHC: 34.1 g/dL (ref 30.0–36.0)
MCV: 90.3 fL (ref 78.0–100.0)
Platelets: 202 10*3/uL (ref 150.0–400.0)
RBC: 5.35 Mil/uL (ref 4.22–5.81)
RDW: 13.7 % (ref 11.5–15.5)
WBC: 5.6 10*3/uL (ref 4.0–10.5)
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
## 2023-03-23 NOTE — Telephone Encounter (Signed)
Labs ordered.

## 2023-03-23 NOTE — Patient Instructions (Addendum)
Give [place] 2-3 business days to get the results of your labs back.   Keep the diet clean and stay active.  Please get me a copy of your advanced directive form at your convenience.   The Shingrix vaccine (for shingles) is a 2 shot series spaced 2-6 months apart. It can make people feel low energy, achy and almost like they have the flu for 48 hours after injection. 1/5 people can have nausea and/or vomiting. Please plan accordingly when deciding on when to get this shot. Call our office for a nurse visit appointment to get this. The second shot of the series is less severe regarding the side effects, but it still lasts 48 hours.   Foods that may reduce pain: 1) Ginger 2) Blueberries 3) Salmon 4) Pumpkin seeds 5) Dark chocolate 6) Turmeric 7) Tart cherries 8) Virgin olive oil 9) Chili peppers 10) Mint 11) Krill oil  Let us know if you need anything.  Healthy Eating Plan Many factors influence your heart health, including eating and exercise habits. Heart (coronary) risk increases with abnormal blood fat (lipid) levels. Heart-healthy meal planning includes limiting unhealthy fats, increasing healthy fats, and making other small dietary changes. This includes maintaining a healthy body weight to help keep lipid levels within a normal range.  WHAT IS MY PLAN?  Your health care provider recommends that you: Drink a glass of water before meals to help with satiety. Eat slowly. An alternative to the water is to add Metamucil. This will help with satiety as well. It does contain calories, unlike water.  WHAT TYPES OF FAT SHOULD I CHOOSE? Choose healthy fats more often. Choose monounsaturated and polyunsaturated fats, such as olive oil and canola oil, flaxseeds, walnuts, almonds, and seeds. Eat more omega-3 fats. Good choices include salmon, mackerel, sardines, tuna, flaxseed oil, and ground flaxseeds. Aim to eat fish at least two times each week. Avoid foods with partially hydrogenated oils  in them. These contain trans fats. Examples of foods that contain trans fats are stick margarine, some tub margarines, cookies, crackers, and other baked goods. If you are going to avoid a fat, this is the one to avoid!  WHAT GENERAL GUIDELINES DO I NEED TO FOLLOW? Check food labels carefully to identify foods with trans fats. Avoid these types of options when possible. Fill one half of your plate with vegetables and green salads. Eat 4-5 servings of vegetables per day. A serving of vegetables equals 1 cup of raw leafy vegetables,  cup of raw or cooked cut-up vegetables, or  cup of vegetable juice. Fill one fourth of your plate with whole grains. Look for the word "whole" as the first word in the ingredient list. Fill one fourth of your plate with lean protein foods. Eat 4-5 servings of fruit per day. A serving of fruit equals one medium whole fruit,  cup of dried fruit,  cup of fresh, frozen, or canned fruit. Try to avoid fruits in cups/syrups as the sugar content can be high. Eat more foods that contain soluble fiber. Examples of foods that contain this type of fiber are apples, broccoli, carrots, beans, peas, and barley. Aim to get 20-30 g of fiber per day. Eat more home-cooked food and less restaurant, buffet, and fast food. Limit or avoid alcohol. Limit foods that are high in starch and sugar. Avoid fried foods when able. Cook foods by using methods other than frying. Baking, boiling, grilling, and broiling are all great options. Other fat-reducing suggestions include: Removing the skin  from poultry. Removing all visible fats from meats. Skimming the fat off of stews, soups, and gravies before serving them. Steaming vegetables in water or broth. Lose weight if you are overweight. Losing just 5-10% of your initial body weight can help your overall health and prevent diseases such as diabetes and heart disease. Increase your consumption of nuts, legumes, and seeds to 4-5 servings per week.  One serving of dried beans or legumes equals  cup after being cooked, one serving of nuts equals 1 ounces, and one serving of seeds equals  ounce or 1 tablespoon.  WHAT ARE GOOD FOODS CAN I EAT? Grains Grainy breads (try to find bread that is 3 g of fiber per slice or greater), oatmeal, light popcorn. Whole-grain cereals. Rice and pasta, including brown rice and those that are made with whole wheat. Edamame pasta is a great alternative to grain pasta. It has a higher protein content. Try to avoid significant consumption of white bread, sugary cereals, or pastries/baked goods.  Vegetables All vegetables. Cooked white potatoes do not count as vegetables.  Fruits All fruits, but limit pineapple and bananas as these fruits have a higher sugar content.  Meats and Other Protein Sources Lean, well-trimmed beef, veal, pork, and lamb. Chicken and [place] without skin. All fish and shellfish. Wild duck, rabbit, pheasant, and venison. Egg whites or low-cholesterol egg substitutes. Dried beans, peas, lentils, and tofu. Seeds and most nuts.  Dairy Low-fat or nonfat cheeses, including ricotta, string, and mozzarella. Skim or 1% milk that is liquid, powdered, or evaporated. Buttermilk that is made with low-fat milk. Nonfat or low-fat yogurt. Soy/Almond milk are good alternatives if you cannot handle dairy.  Beverages Water is the best for you. Sports drinks with less sugar are more desirable unless you are a highly active athlete.  Sweets and Desserts Sherbets and fruit ices. Honey, jam, marmalade, jelly, and syrups. Dark chocolate.  Eat all sweets and desserts in moderation.  Fats and Oils Nonhydrogenated (trans-free) margarines. Vegetable oils, including soybean, sesame, sunflower, olive, peanut, safflower, corn, canola, and cottonseed. Salad dressings or mayonnaise that are made with a vegetable oil. Limit added fats and oils that you use for cooking, baking, salads, and as spreads.  Other Cocoa  powder. Coffee and tea. Most condiments.  The items listed above may not be a complete list of recommended foods or beverages. Contact your dietitian for more options.

## 2023-03-23 NOTE — Telephone Encounter (Signed)
Pt scheduled to repeat labs 12/20. Please add orders.

## 2023-05-01 ENCOUNTER — Other Ambulatory Visit (INDEPENDENT_AMBULATORY_CARE_PROVIDER_SITE_OTHER): Payer: 59

## 2023-05-01 DIAGNOSIS — E782 Mixed hyperlipidemia: Secondary | ICD-10-CM | POA: Diagnosis not present

## 2023-05-01 LAB — LIPID PANEL
Cholesterol: 165 mg/dL (ref 0–200)
HDL: 34.6 mg/dL — ABNORMAL LOW (ref >39.00)
LDL Cholesterol: 94 mg/dL (ref 0–99)
NonHDL: 130.02
Total CHOL/HDL Ratio: 5
Triglycerides: 180 mg/dL — ABNORMAL HIGH (ref 0.0–149.0)
VLDL: 36 mg/dL (ref 0.0–40.0)

## 2023-10-22 ENCOUNTER — Encounter: Payer: Self-pay | Admitting: Family Medicine

## 2023-10-23 ENCOUNTER — Encounter: Payer: Self-pay | Admitting: Family Medicine

## 2023-10-23 ENCOUNTER — Ambulatory Visit (INDEPENDENT_AMBULATORY_CARE_PROVIDER_SITE_OTHER): Admitting: Family Medicine

## 2023-10-23 VITALS — BP 130/82 | HR 74 | Temp 98.0°F | Resp 16 | Ht 73.0 in | Wt 218.0 lb

## 2023-10-23 DIAGNOSIS — H6992 Unspecified Eustachian tube disorder, left ear: Secondary | ICD-10-CM

## 2023-10-23 MED ORDER — PREDNISONE 20 MG PO TABS: 40.0000 mg | ORAL_TABLET | Freq: Every day | ORAL | 0 refills | Status: DC

## 2023-10-23 MED ORDER — PREDNISONE 20 MG PO TABS
40.0000 mg | ORAL_TABLET | Freq: Every day | ORAL | 0 refills | Status: AC
Start: 2023-10-23 — End: 2023-10-28

## 2023-10-23 NOTE — Patient Instructions (Addendum)
 OK to take Tylenol 1000 mg (2 extra strength tabs) or 975 mg (3 regular strength tabs) every 6 hours as needed.  No ibuprofen while on the prednisone  please.   Let us  know if you need anything.

## 2023-10-23 NOTE — Progress Notes (Signed)
 Chief Complaint  Patient presents with   Ear Pain    Left Ear Pain    Pt is here for left ear pain. Duration: 3 weeks Progression: unchanged Associated symptoms: sore throat on L  Denies: congestion and coryza, fevers, bleeding, or discharge from ear, hearing loss Treatment to date: ibuprofen, Tylenol, Zyrtec, INCS Trauma? No  Past Medical History:  Diagnosis Date   Fractured hand    left hand 2002   GERD (gastroesophageal reflux disease)     BP 130/82 (BP Location: Left Arm, Patient Position: Sitting)   Pulse 74   Temp 98 F (36.7 C) (Oral)   Resp 16   Ht 6' 1 (1.854 m)   Wt 218 lb (98.9 kg)   SpO2 98%   BMI 28.76 kg/m  General: Awake, alert, appearing stated age HEENT:  L ear- Canal patent without drainage or erythema, TM is neg R ear- canal patent without drainage or erythema, TM is neg Nose- nares patent and without discharge Mouth- Lips, gums and dentition unremarkable, pharynx is without erythema or exudate Neck: No adenopathy Lungs: Normal effort, no accessory muscle use Psych: Age appropriate judgment and insight, normal mood and affect  Dysfunction of left eustachian tube - Plan: predniSONE  (DELTASONE ) 20 MG tablet  5 d pred burst 40 mg/d. Tylenol prn.  F/u prn.  Pt voiced understanding and agreement to the plan.  Darrell Dials [place], DO 10/23/23 4:02 PM

## 2023-11-24 ENCOUNTER — Encounter: Payer: Self-pay | Admitting: Family Medicine

## 2024-03-17 ENCOUNTER — Other Ambulatory Visit: Payer: Self-pay

## 2024-03-17 ENCOUNTER — Encounter: Payer: Self-pay | Admitting: Family Medicine

## 2024-03-17 MED ORDER — PANTOPRAZOLE SODIUM 40 MG PO TBEC: DELAYED_RELEASE_TABLET | ORAL | 3 refills | Status: AC

## 2024-03-23 ENCOUNTER — Encounter: Payer: 59 | Admitting: Family Medicine

## 2024-03-29 ENCOUNTER — Ambulatory Visit (INDEPENDENT_AMBULATORY_CARE_PROVIDER_SITE_OTHER): Admitting: Family Medicine

## 2024-03-29 ENCOUNTER — Encounter: Payer: Self-pay | Admitting: Family Medicine

## 2024-03-29 ENCOUNTER — Ambulatory Visit: Payer: Self-pay | Admitting: Family Medicine

## 2024-03-29 VITALS — BP 126/82 | HR 77 | Temp 98.0°F | Resp 16 | Ht 73.0 in | Wt 214.4 lb

## 2024-03-29 DIAGNOSIS — Z125 Encounter for screening for malignant neoplasm of prostate: Secondary | ICD-10-CM

## 2024-03-29 DIAGNOSIS — Z0000 Encounter for general adult medical examination without abnormal findings: Secondary | ICD-10-CM | POA: Diagnosis not present

## 2024-03-29 DIAGNOSIS — Z23 Encounter for immunization: Secondary | ICD-10-CM

## 2024-03-29 LAB — CBC
HCT: 46.3 % (ref 39.0–52.0)
Hemoglobin: 15.9 g/dL (ref 13.0–17.0)
MCHC: 34.4 g/dL (ref 30.0–36.0)
MCV: 88.3 fl (ref 78.0–100.0)
Platelets: 192 K/uL (ref 150.0–400.0)
RBC: 5.24 Mil/uL (ref 4.22–5.81)
RDW: 13.9 % (ref 11.5–15.5)
WBC: 5.6 K/uL (ref 4.0–10.5)

## 2024-03-29 LAB — COMPREHENSIVE METABOLIC PANEL WITH GFR
ALT: 21 U/L (ref 0–53)
AST: 19 U/L (ref 0–37)
Albumin: 4.8 g/dL (ref 3.5–5.2)
Alkaline Phosphatase: 55 U/L (ref 39–117)
BUN: 14 mg/dL (ref 6–23)
CO2: 29 meq/L (ref 19–32)
Calcium: 9.8 mg/dL (ref 8.4–10.5)
Chloride: 103 meq/L (ref 96–112)
Creatinine, Ser: 1.05 mg/dL (ref 0.40–1.50)
GFR: 79.52 mL/min (ref >60.00)
Glucose, Bld: 80 mg/dL (ref 70–99)
Potassium: 4.1 meq/L (ref 3.5–5.1)
Sodium: 141 meq/L (ref 135–145)
Total Bilirubin: 1 mg/dL (ref 0.2–1.2)
Total Protein: 7.1 g/dL (ref 6.0–8.3)

## 2024-03-29 LAB — HEPATITIS B SURFACE ANTIBODY, QUANTITATIVE: Hep B S AB Quant (Post): <5 m[IU]/mL — ABNORMAL LOW (ref >=10)

## 2024-03-29 LAB — PSA: PSA: 0.87 ng/mL (ref 0.10–4.00)

## 2024-03-29 LAB — LIPID PANEL
Cholesterol: 178 mg/dL (ref 0–200)
HDL: 37 mg/dL — ABNORMAL LOW (ref >39.00)
LDL Cholesterol: 108 mg/dL — ABNORMAL HIGH (ref 0–99)
NonHDL: 141.27
Total CHOL/HDL Ratio: 5
Triglycerides: 167 mg/dL — ABNORMAL HIGH (ref 0.0–149.0)
VLDL: 33.4 mg/dL (ref 0.0–40.0)

## 2024-03-29 MED ORDER — FLUTICASONE PROPIONATE 50 MCG/ACT NA SUSP: 2.0000 | Freq: Every day | NASAL | 2 refills | Status: AC
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
## 2024-03-29 NOTE — Patient Instructions (Addendum)
 Give us  2-3 business days to get the results of your labs back.   Keep the diet clean and stay active.  Please get me a copy of your advanced directive form at your convenience.   Heat (pad or rice pillow in microwave) over affected area, 10-15 minutes twice daily.   Ice/cold pack over area for 10-15 min twice daily.  OK to take Tylenol 1000 mg (2 extra strength tabs) or 975 mg (3 regular strength tabs) every 6 hours as needed.  Please consider getting your pneumonia vaccine at the pharmacy or here.   Let us  know if you need anything.  Trapezius stretches/exercises Do exercises exactly as told by your health care provider and adjust them as directed. It is normal to feel mild stretching, pulling, tightness, or discomfort as you do these exercises, but you should stop right away if you feel sudden pain or your pain gets worse.   Stretching and range of motion exercises These exercises warm up your muscles and joints and improve the movement and flexibility of your shoulder. These exercises can also help to relieve pain, numbness, and tingling. If you are unable to do any of the following for any reason, do not further attempt to do it.   Exercise A: Flexion, standing     Stand and hold a broomstick, a cane, or a similar object. Place your hands a little more than shoulder-width apart on the object. Your left / right hand should be palm-up, and your other hand should be palm-down. Push the stick to raise your left / right arm out to your side and then over your head. Use your other hand to help move the stick. Stop when you feel a stretch in your shoulder, or when you reach the angle that is recommended by your health care provider. Avoid shrugging your shoulder while you raise your arm. Keep your shoulder blade tucked down toward your spine. Hold for 30 seconds. Slowly return to the starting position. Repeat 2 times. Complete this exercise 3 times per week.  Exercise B: Abduction,  supine     Lie on your back and hold a broomstick, a cane, or a similar object. Place your hands a little more than shoulder-width apart on the object. Your left / right hand should be palm-up, and your other hand should be palm-down. Push the stick to raise your left / right arm out to your side and then over your head. Use your other hand to help move the stick. Stop when you feel a stretch in your shoulder, or when you reach the angle that is recommended by your health care provider. Avoid shrugging your shoulder while you raise your arm. Keep your shoulder blade tucked down toward your spine. Hold for 30 seconds. Slowly return to the starting position. Repeat 2 times. Complete this exercise 3 times per week.  Exercise C: Flexion, active-assisted     Lie on your back. You may bend your knees for comfort. Hold a broomstick, a cane, or a similar object. Place your hands about shoulder-width apart on the object. Your palms should face toward your feet. Raise the stick and move your arms over your head and behind your head, toward the floor. Use your healthy arm to help your left / right arm move farther. Stop when you feel a gentle stretch in your shoulder, or when you reach the angle where your health care provider tells you to stop. Hold for 30 seconds. Slowly return to the starting position.  Repeat 2 times. Complete this exercise 3 times per week.  Exercise D: External rotation and abduction     Stand in a door frame with one of your feet slightly in front of the other. This is called a staggered stance. Choose one of the following positions as told by your health care provider: Place your hands and forearms on the door frame above your head. Place your hands and forearms on the door frame at the height of your head. Place your hands on the door frame at the height of your elbows. Slowly move your weight onto your front foot until you feel a stretch across your chest and in the front  of your shoulders. Keep your head and chest upright and keep your abdominal muscles tight. Hold for 30 seconds. To release the stretch, shift your weight to your back foot. Repeat 2 times. Complete this stretch 3 times per week.  Strengthening exercises These exercises build strength and endurance in your shoulder. Endurance is the ability to use your muscles for a long time, even after your muscles get tired. Exercise E: Scapular depression and adduction  Sit on a stable chair. Support your arms in front of you with pillows, armrests, or a tabletop. Keep your elbows in line with the sides of your body. Gently move your shoulder blades down toward your middle back. Relax the muscles on the tops of your shoulders and in the back of your neck. Hold for 3 seconds. Slowly release the tension and relax your muscles completely before doing this exercise again. Repeat for a total of 10 repetitions. After you have practiced this exercise, try doing the exercise without the arm support. Then, try the exercise while standing instead of sitting. Repeat 2 times. Complete this exercise 3 times per week.  Exercise F: Shoulder abduction, isometric     Stand or sit about 4-6 inches (10-15 cm) from a wall with your left / right side facing the wall. Bend your left / right elbow and gently press your elbow against the wall. Increase the pressure slowly until you are pressing as hard as you can without shrugging your shoulder. Hold for 3 seconds. Slowly release the tension and relax your muscles completely. Repeat for a total of 10 repetitions. Repeat 2 times. Complete this exercise 3 times per week.  Exercise G: Shoulder flexion, isometric     Stand or sit about 4-6 inches (10-15 cm) away from a wall with your left / right side facing the wall. Keep your left / right elbow straight and gently press the top of your fist against the wall. Increase the pressure slowly until you are pressing as hard as you  can without shrugging your shoulder. Hold for 10-15 seconds. Slowly release the tension and relax your muscles completely. Repeat for a total of 10 repetitions. Repeat 2 times. Complete this exercise 3 times per week.  Exercise H: Internal rotation     Sit in a stable chair without armrests, or stand. Secure an exercise band at your left / right side, at elbow height. Place a soft object, such as a folded towel or a small pillow, under your left / right upper arm so your elbow is a few inches (about 8 cm) away from your side. Hold the end of the exercise band so the band stretches. Keeping your elbow pressed against the soft object under your arm, move your forearm across your body toward your abdomen. Keep your body steady so the movement is only  coming from your shoulder. Hold for 3 seconds. Slowly return to the starting position. Repeat for a total of 10 repetitions. Repeat 2 times. Complete this exercise 3 times per week.  Exercise I: External rotation     Sit in a stable chair without armrests, or stand. Secure an exercise band at your left / right side, at elbow height. Place a soft object, such as a folded towel or a small pillow, under your left / right upper arm so your elbow is a few inches (about 8 cm) away from your side. Hold the end of the exercise band so the band stretches. Keeping your elbow pressed against the soft object under your arm, move your forearm out, away from your abdomen. Keep your body steady so the movement is only coming from your shoulder. Hold for 3 seconds. Slowly return to the starting position. Repeat for a total of 10 repetitions. Repeat 2 times. Complete this exercise 3 times per week. Exercise J: Shoulder extension  Sit in a stable chair without armrests, or stand. Secure an exercise band to a stable object in front of you so the band is at shoulder height. Hold one end of the exercise band in each hand. Your palms should face each  other. Straighten your elbows and lift your hands up to shoulder height. Step back, away from the secured end of the exercise band, until the band stretches. Squeeze your shoulder blades together and pull your hands down to the sides of your thighs. Stop when your hands are straight down by your sides. Do not let your hands go behind your body. Hold for 3 seconds. Slowly return to the starting position. Repeat for a total of 10 repetitions. Repeat 2 times. Complete this exercise 3 times per week.  Exercise K: Shoulder extension, prone     Lie on your abdomen on a firm surface so your left / right arm hangs over the edge. Hold a 5 lb weight in your hand so your palm faces in toward your body. Your arm should be straight. Squeeze your shoulder blade down toward the middle of your back. Slowly raise your arm behind you, up to the height of the surface that you are lying on. Keep your arm straight. Hold for 3 seconds. Slowly return to the starting position and relax your muscles. Repeat for a total of 10 repetitions. Repeat 2 times. Complete this exercise 3 times per week.   Exercise L: Horizontal abduction, prone  Lie on your abdomen on a firm surface so your left / right arm hangs over the edge. Hold a 5 lb weight in your hand so your palm faces toward your feet. Your arm should be straight. Squeeze your shoulder blade down toward the middle of your back. Bend your elbow so your hand moves up, until your elbow is bent to an L shape (90 degrees). With your elbow bent, slowly move your forearm forward and up. Raise your hand up to the height of the surface that you are lying on. Your upper arm should not move, and your elbow should stay bent. At the top of the movement, your palm should face the floor. Hold for 3 seconds. Slowly return to the starting position and relax your muscles. Repeat for a total of 10 repetitions. Repeat 2 times. Complete this exercise 3 times per week.  Exercise  M: Horizontal abduction, standing  Sit on a stable chair, or stand. Secure an exercise band to a stable object in front of  you so the band is at shoulder height. Hold one end of the exercise band in each hand. Straighten your elbows and lift your hands straight in front of you, up to shoulder height. Your palms should face down, toward the floor. Step back, away from the secured end of the exercise band, until the band stretches. Move your arms out to your sides, and keep your arms straight. Hold for 3 seconds. Slowly return to the starting position. Repeat for a total of 10 repetitions. Repeat 2 times. Complete this exercise 3 times per week.  Exercise N: Scapular retraction and elevation  Sit on a stable chair, or stand. Secure an exercise band to a stable object in front of you so the band is at shoulder height. Hold one end of the exercise band in each hand. Your palms should face each other. Sit in a stable chair without armrests, or stand. Step back, away from the secured end of the exercise band, until the band stretches. Squeeze your shoulder blades together and lift your hands over your head. Keep your elbows straight. Hold for 3 seconds. Slowly return to the starting position. Repeat for a total of 10 repetitions. Repeat 2 times. Complete this exercise 3 times per week.  This information is not intended to replace advice given to you by your health care provider. Make sure you discuss any questions you have with your health care provider. Document Released: 04/28/2005 Document Revised: 01/03/2016 Document Reviewed: 03/15/2015 Elsevier Interactive Patient Education  2017 [employer].

## 2024-03-29 NOTE — Addendum Note (Signed)
 Addended by: Read Bonelli M on: 03/29/2024 09:47 AM   Modules accepted: Orders

## 2024-05-22 ENCOUNTER — Encounter: Payer: Self-pay | Admitting: Family Medicine

## 2025-04-04 ENCOUNTER — Encounter: Admitting: Family Medicine
